# Patient Record
Sex: Female | Born: 1990 | Race: White | Hispanic: No | Marital: Married | Smoking: Never smoker
Health system: Southern US, Community
[De-identification: ages and names within clinical notes are randomized; demographics above are authoritative.]

## PROBLEM LIST (undated history)

## (undated) ENCOUNTER — Inpatient Hospital Stay (HOSPITAL_COMMUNITY): Payer: Self-pay

## (undated) HISTORY — PX: NO PAST SURGERIES: SHX2092

---

## 2017-10-04 ENCOUNTER — Encounter: Payer: Self-pay | Admitting: Obstetrics and Gynecology

## 2017-10-04 ENCOUNTER — Other Ambulatory Visit: Payer: Self-pay

## 2017-10-04 ENCOUNTER — Ambulatory Visit: Payer: BLUE CROSS/BLUE SHIELD | Admitting: Obstetrics and Gynecology

## 2017-10-04 VITALS — BP 112/60 | HR 76 | Resp 16 | Ht 63.0 in | Wt 129.0 lb

## 2017-10-04 DIAGNOSIS — Z01419 Encounter for gynecological examination (general) (routine) without abnormal findings: Secondary | ICD-10-CM

## 2017-10-04 DIAGNOSIS — Z7189 Other specified counseling: Secondary | ICD-10-CM | POA: Diagnosis not present

## 2017-10-04 DIAGNOSIS — Z23 Encounter for immunization: Secondary | ICD-10-CM | POA: Diagnosis not present

## 2017-10-04 DIAGNOSIS — Z7185 Encounter for immunization safety counseling: Secondary | ICD-10-CM

## 2017-10-04 NOTE — Patient Instructions (Signed)

## 2017-10-04 NOTE — Progress Notes (Signed)
27 y.o. G0P0000 Married [place] female here as a new patient for an annual exam.    Loosing her hair.   Taking birth control from [place].  She will refill from [place].   From [place].  Traveling to [place] in October.  PCP:  No PCP  Patient's last menstrual period was 09/25/2017.     Period Cycle (Days): 21 Period Duration (Days): 5 Period Pattern: Regular Menstrual Flow: Moderate Menstrual Control: Maxi pad Menstrual Control Change Freq (Hours): 3-4 Dysmenorrhea: None     Sexually active: Yes.    The current method of family planning is OCP (estrogen/progesterone).   Yasmin.  Exercising: Yes.    running and body pump Smoker:  no  Health Maintenance: Pap:  2 years ago per patient - normal.   History of abnormal Pap:  no TDaP:  UTD per patient Gardasil:   no HIV: never Hep C:  never Screening Labs: discuss today   reports that she has never smoked. She has never used smokeless tobacco. She reports that she drinks about 1.0 standard drinks of alcohol per week. She reports that she does not use drugs.  History reviewed. No pertinent past medical history.  History reviewed. No pertinent surgical history.  Current Outpatient Medications  Medication Sig Dispense Refill  . NON FORMULARY Elani Ciclo 21 - take one tablet by mouth once daily.     No current facility-administered medications for this visit.     Family History  Problem Relation Age of Onset  . Thyroid disease Mother   . Hypertension Father     Review of Systems  All other systems reviewed and are negative.   Exam:   BP 112/60 (BP Location: Right Arm, Patient Position: Sitting, Cuff Size: Normal)   Pulse 76   Resp 16   Ht 5\' 3"  (1.6 m)   Wt 129 lb (58.5 kg)   LMP 09/25/2017   BMI 22.85 kg/m     General appearance: alert, cooperative and appears stated age Head: Normocephalic, without obvious abnormality, atraumatic Neck: no adenopathy, supple, symmetrical, trachea midline and thyroid normal  to inspection and palpation Lungs: clear to auscultation bilaterally Breasts: normal appearance, no masses or tenderness, No nipple retraction or dimpling, No nipple discharge or bleeding, No axillary or supraclavicular adenopathy Heart: regular rate and rhythm Abdomen: soft, non-tender; no masses, no organomegaly Extremities: extremities normal, atraumatic, no cyanosis or edema Skin: Skin color, texture, turgor normal. No rashes or lesions Lymph nodes: Cervical, supraclavicular, and axillary nodes normal. No abnormal inguinal nodes palpated Neurologic: Grossly normal  Pelvic: External genitalia:  no lesions              Urethra:  normal appearing urethra with no masses, tenderness or lesions              Bartholins and Skenes: normal                 Vagina: normal appearing vagina with normal color and discharge, no lesions              Cervix: no lesions              Pap taken: No. Bimanual Exam:  Uterus:  normal size, contour, position, consistency, mobility, non-tender              Adnexa: no mass, fullness, tenderness                 Chaperone was present for exam.  Assessment:   Well woman  visit with normal exam. Hair loss.   Plan: Mammogram screening age 64. Recommended self breast awareness. Pap and HR HPV as above. Guidelines for Calcium, Vitamin D, regular exercise program including cardiovascular and weight bearing exercise. TSH, cholesterol, CBC, and CMP.  She declines testosterone check.  Will check iron and ferritin if CBC indicate anemia.  We discussed stress as a potential reason for hair loss.  To dermatology if persists. Will start Gardasil vaccine.  Follow up annually and prn.   After visit summary provided.

## 2017-10-05 LAB — COMPREHENSIVE METABOLIC PANEL
ALT: 17 IU/L (ref 0–32)
AST: 17 IU/L (ref 0–40)
Albumin/Globulin Ratio: 1.8 (ref 1.2–2.2)
Albumin: 4.2 g/dL (ref 3.5–5.5)
Alkaline Phosphatase: 49 IU/L (ref 39–117)
BUN/Creatinine Ratio: 12 (ref 9–23)
BUN: 10 mg/dL (ref 6–20)
Bilirubin Total: 0.3 mg/dL (ref 0.0–1.2)
CALCIUM: 9.4 mg/dL (ref 8.7–10.2)
CO2: 25 mmol/L (ref 20–29)
CREATININE: 0.81 mg/dL (ref 0.57–1.00)
Chloride: 100 mmol/L (ref 96–106)
GFR, EST AFRICAN AMERICAN: 114 mL/min/{1.73_m2} (ref >59)
GFR, EST NON AFRICAN AMERICAN: 99 mL/min/{1.73_m2} (ref >59)
GLUCOSE: 81 mg/dL (ref 65–99)
Globulin, Total: 2.4 g/dL (ref 1.5–4.5)
POTASSIUM: 4.5 mmol/L (ref 3.5–5.2)
Sodium: 138 mmol/L (ref 134–144)
TOTAL PROTEIN: 6.6 g/dL (ref 6.0–8.5)

## 2017-10-05 LAB — CBC
HEMOGLOBIN: 14.1 g/dL (ref 11.1–15.9)
Hematocrit: 41.1 % (ref 34.0–46.6)
MCH: 31.1 pg (ref 26.6–33.0)
MCHC: 34.3 g/dL (ref 31.5–35.7)
MCV: 91 fL (ref 79–97)
Platelets: 330 10*3/uL (ref 150–450)
RBC: 4.54 x10E6/uL (ref 3.77–5.28)
RDW: 13.1 % (ref 12.3–15.4)
WBC: 10.7 10*3/uL (ref 3.4–10.8)

## 2017-10-05 LAB — LIPID PANEL
CHOL/HDL RATIO: 1.7 ratio (ref 0.0–4.4)
Cholesterol, Total: 232 mg/dL — ABNORMAL HIGH (ref 100–199)
HDL: 133 mg/dL (ref >39)
LDL Calculated: 74 mg/dL (ref 0–99)
TRIGLYCERIDES: 124 mg/dL (ref 0–149)
VLDL CHOLESTEROL CAL: 25 mg/dL (ref 5–40)

## 2017-10-05 LAB — TSH: TSH: 2.53 u[IU]/mL (ref 0.450–4.500)

## 2017-12-02 NOTE — Progress Notes (Signed)
Patient in today for 2nd Gardasil injection. Contraception: OPC LMP: 11/15/17 Last AEX: 10/04/17 with BS   Injection given in Left Deltoid Patient tolerated shot well.   Patient informed next injection due in about 4 months. After 03/26/18  Advised patient, if not on birth control, to return for next injection with cycle.   Routed to provider for final review.  Encounter closed.

## 2017-12-03 ENCOUNTER — Ambulatory Visit (INDEPENDENT_AMBULATORY_CARE_PROVIDER_SITE_OTHER): Payer: BLUE CROSS/BLUE SHIELD

## 2017-12-03 VITALS — BP 108/62 | HR 74 | Resp 17 | Ht 63.0 in | Wt 127.0 lb

## 2017-12-03 DIAGNOSIS — Z23 Encounter for immunization: Secondary | ICD-10-CM | POA: Diagnosis not present

## 2018-04-04 NOTE — Progress Notes (Signed)
Patient in today for 3rd  Gardasil injection.   Contraception: OPC LMP: 03/16/18 Last AEX: 10/04/17 with BS  Injection given in Left Deltoid. Patient tolerated shot well.   Patient has completed the the HPV vaccination series.   Routed to provider for final review.  Encounter closed.

## 2018-04-06 ENCOUNTER — Ambulatory Visit: Payer: Self-pay | Admitting: Medical

## 2018-04-06 ENCOUNTER — Ambulatory Visit (INDEPENDENT_AMBULATORY_CARE_PROVIDER_SITE_OTHER): Payer: BLUE CROSS/BLUE SHIELD

## 2018-04-06 VITALS — BP 116/62 | HR 64 | Resp 16 | Ht 63.0 in | Wt 132.0 lb

## 2018-04-06 DIAGNOSIS — Z23 Encounter for immunization: Secondary | ICD-10-CM

## 2018-05-16 ENCOUNTER — Encounter: Payer: Self-pay | Admitting: Obstetrics and Gynecology

## 2018-05-16 ENCOUNTER — Ambulatory Visit (INDEPENDENT_AMBULATORY_CARE_PROVIDER_SITE_OTHER): Payer: BLUE CROSS/BLUE SHIELD | Admitting: Obstetrics and Gynecology

## 2018-05-16 ENCOUNTER — Other Ambulatory Visit: Payer: Self-pay

## 2018-05-16 ENCOUNTER — Encounter (INDEPENDENT_AMBULATORY_CARE_PROVIDER_SITE_OTHER): Payer: Self-pay

## 2018-05-16 ENCOUNTER — Telehealth: Payer: Self-pay | Admitting: Obstetrics and Gynecology

## 2018-05-16 VITALS — BP 126/70 | HR 80 | Temp 98.2°F | Resp 14 | Ht 63.0 in | Wt 125.0 lb

## 2018-05-16 DIAGNOSIS — N6320 Unspecified lump in the left breast, unspecified quadrant: Secondary | ICD-10-CM | POA: Diagnosis not present

## 2018-05-16 NOTE — Telephone Encounter (Signed)
Spoke with patients spouse, Arvilla Market, ok per dpr. Requesting OV for left breast lump. Noticed on 05/12/18, painful to touch. Denies skin changes, nipple d/c, fever/chills, cough. LMP 05/07/18. OV scheduled for today at 2pm with Dr. Edward Jolly. Spouse verbalizes understanding and is agreeable.   Encounter closed.

## 2018-05-16 NOTE — Progress Notes (Signed)
GYNECOLOGY  VISIT   HPI: 28 y.o.   Married  [place]  female   G0P0000 with Patient's last menstrual period was 05/07/2018.   here for left breast lump; patient noticed lump on Thursday.    Not sure if having pain or not.   She had a breast US in [place] 2 years ago as a routine exam, and this was normal.  No recent trauma.   Paternal GM with breast cancer.  GYNECOLOGIC HISTORY: Patient's last menstrual period was 05/07/2018. Contraception:  Yasmin Menopausal hormone therapy:  n/a Last mammogram:  n/a Last pap smear:   2 years ago per patient - normal        OB History    Gravida  0   Para  0   Term  0   Preterm  0   AB  0   Living  0     SAB  0   TAB  0   Ectopic  0   Multiple  0   Live Births  0              There are no active problems to display for this patient.   No past medical history on file.  No past surgical history on file.  Current Outpatient Medications  Medication Sig Dispense Refill  . NON FORMULARY Elani Ciclo 21 - take one tablet by mouth once daily.     No current facility-administered medications for this visit.      ALLERGIES: Patient has no known allergies.  Family History  Problem Relation Age of Onset  . Thyroid disease Mother   . Hypertension Father     Social History   Socioeconomic History  . Marital status: Married    Spouse name: Not on file  . Number of children: Not on file  . Years of education: Not on file  . Highest education level: Not on file  Occupational History  . Not on file  Social Needs  . Financial resource strain: Not on file  . Food insecurity:    Worry: Not on file    Inability: Not on file  . Transportation needs:    Medical: Not on file    Non-medical: Not on file  Tobacco Use  . Smoking status: Never Smoker  . Smokeless tobacco: Never Used  Substance and Sexual Activity  . Alcohol use: Yes    Alcohol/week: 1.0 standard drinks    Types: 1 Cans of beer per week  . Drug use:  Never  . Sexual activity: Yes    Birth control/protection: Pill  Lifestyle  . Physical activity:    Days per week: Not on file    Minutes per session: Not on file  . Stress: Not on file  Relationships  . Social connections:    Talks on phone: Not on file    Gets together: Not on file    Attends religious service: Not on file    Active member of club or organization: Not on file    Attends meetings of clubs or organizations: Not on file    Relationship status: Not on file  . Intimate partner violence:    Fear of current or ex partner: Not on file    Emotionally abused: Not on file    Physically abused: Not on file    Forced sexual activity: Not on file  Other Topics Concern  . Not on file  Social History Narrative  . Not on file  Review of Systems  Constitutional:       Left breast lump  HENT: Negative.   Eyes: Negative.   Respiratory: Negative.   Cardiovascular: Negative.   Gastrointestinal: Negative.   Endocrine: Negative.   Genitourinary: Negative.   Musculoskeletal: Negative.   Skin: Negative.   Allergic/Immunologic: Negative.   Neurological: Negative.   Hematological: Negative.   Psychiatric/Behavioral: Negative.     PHYSICAL EXAMINATION:    BP 126/70 (BP Location: Left Arm, Patient Position: Sitting, Cuff Size: Normal)   Pulse 80   Temp 98.2 F (36.8 C) (Oral)   Resp 14   Ht 5\' 3"  (1.6 m)   Wt 125 lb (56.7 kg)   LMP 05/07/2018   BMI 22.14 kg/m     General appearance: alert, cooperative and appears stated age   Breasts: right - normal appearance, no masses or tenderness, No nipple retraction or dimpling, No nipple discharge or bleeding, No axillary adenopathy  Left breast - normal appearance,  8 mm oval mass at 11:00 and nontender, No nipple retraction or dimpling, No nipple discharge or bleeding, No axillary adenopathy  Chaperone was present for exam.  ASSESSMENT  Left breast mass.   PLAN  We discussed fibrocystic breast disease.  We  reviewed options for dx [place] versus breast recheck in 6 weeks.  Will do the latter.  She will continue to monitor and be seen sooner if the mass is increasing in size.  Questions invited and answered.   An After Visit Summary was printed and given to the patient.  __15____ minutes face to face time of which over 50% was spent in counseling.

## 2018-05-16 NOTE — Telephone Encounter (Signed)
Patient's spouse (ok per dpr) calling because she found a lump in her left breast. His number is [phone].

## 2018-05-16 NOTE — Patient Instructions (Signed)
Fibrocystic Breast Changes    Fibrocystic breast changes are changes in breast tissue that can cause breasts to become swollen, lumpy, or painful. This can happen due to buildup of scar-like tissue (fibrous tissue) or the forming of fluid-filled lumps (cysts) in the breast. This is a common condition, and it is not cancerous (is benign). The exact cause is not known, but it seems to occur when women go through hormonal changes during their menstrual cycle. Fibrocystic breast changes can affect one or both breasts.  What are the causes?  The exact cause of fibrocystic breast changes is not known. However, this condition:   May be related to the female hormones estrogen and progesterone.   May be influenced by family traits that get passed from parent to child (genetics).  What are the signs or symptoms?  Symptoms of this condition may affect one or both breasts, and may include:   Tenderness, mild discomfort, or pain.   Swelling.   Rope-like tissue that can be felt when touching the breast.   Lumps in one or both breasts.   Changes in breast size. Breasts may get larger before the menstrual period and smaller after the menstrual period.   Green or dark brown discharge from the nipple.  Symptoms are usually worse before menstrual periods start, and they get better toward the end of menstrual periods.  How is this diagnosed?  This condition is diagnosed based on your medical history and a physical exam of your breasts. You may also have tests, such as:   A breast X-ray (mammogram).   Ultrasound of your breasts.   MRI.   Removal of a breast tissue sample for testing (breast biopsy). This may be done if your health care provider thinks that something else may be causing changes in your breasts.  How is this treated?  Often, treatment is not needed for this condition. In some cases, treatment may include:   Taking over-the-counter pain relievers to help lessen pain or discomfort.   Limiting or avoiding  caffeine. Foods and beverages that contain caffeine include chocolate, soda, coffee, and tea.   Reducing sugar and fat in your diet.  Your health care provider may also recommend:   A procedure to remove fluid from a cyst that is causing pain (fine needle aspiration).   Surgery to remove a cyst that is large or tender or does not go away.  Follow these instructions at home:   Examine your breasts after every menstrual period. If you do not have menstrual periods, check your breasts on the first day of every month. Feel for changes in your breasts, such as:  ? More tenderness.  ? A new growth.  ? A change in size.  ? A change in an existing lump.   Take over-the-counter and prescription medicines only as told by your health care provider.   Wear a well-fitted support or sports bra, especially when exercising.   Decrease or avoid caffeine, fat, and sugar in your diet as directed by your health care provider.  Contact a health care provider if:   You have fluid leaking from your nipple, especially if it is bloody.   You have new lumps or bumps in your breast.   Your breast becomes enlarged, red, and painful.   You have areas of your breast that pucker inward.   Your nipple appears flat or indented.  Get help right away if:   You have redness of your breast and the redness is spreading.    Summary   Fibrocystic breast changes are changes in breast tissue that can cause breasts to become swollen, lumpy, or painful.   This condition may be related to the female hormones estrogen and progesterone.   With this condition, it is important to examine your breasts after every menstrual period. If you do not have menstrual periods, check your breasts on the first day of every month.  This information is not intended to replace advice given to you by your health care provider. Make sure you discuss any questions you have with your health care provider.  Document Released: 11/12/2005 Document Revised: 10/08/2015  Document Reviewed: 09/25/2015  Elsevier Interactive Patient Education  2019 Elsevier Inc[employer]

## 2018-06-27 ENCOUNTER — Telehealth: Payer: Self-pay | Admitting: Obstetrics and Gynecology

## 2018-06-27 ENCOUNTER — Encounter: Payer: Self-pay | Admitting: Obstetrics and Gynecology

## 2018-06-27 ENCOUNTER — Ambulatory Visit: Payer: BLUE CROSS/BLUE SHIELD | Admitting: Obstetrics and Gynecology

## 2018-06-27 ENCOUNTER — Other Ambulatory Visit: Payer: Self-pay

## 2018-06-27 VITALS — BP 116/72 | HR 76 | Temp 98.2°F | Resp 12 | Ht 63.0 in | Wt 131.0 lb

## 2018-06-27 DIAGNOSIS — Z3169 Encounter for other general counseling and advice on procreation: Secondary | ICD-10-CM

## 2018-06-27 DIAGNOSIS — N6320 Unspecified lump in the left breast, unspecified quadrant: Secondary | ICD-10-CM

## 2018-06-27 NOTE — Telephone Encounter (Signed)
Spoke with Joaquim Lai at [facility]. Was advised due to patients age, left breast US only. Will need to confirm age of PGM at time of breast cancer Dx.   Call placed to patients spouse, Roderick Pee, was advised patient unsure of age of breast cancer Dx in [facility], "never met her".   Call returned to [facility], spoke with Clarise Cruz. Bilater Dx MMG and left [place] scheduled for first available appt on 6/4 arriving at 12:40pm, appointment at 1pm. Patient will need to request previous breast US images from [place] prior to apportionment. If patient able to obtain, will need to reschedule appt. If unable to obtain images, keep appt as scheduled.

## 2018-06-27 NOTE — Telephone Encounter (Signed)
Spoke with patients spouse, Arvilla Market. Advised of appointment details as seen below at [facility], contact information provided. Spouse states they are unable to obtain records from [place] from previous breast imaging. Spouse verbalizes understanding and is agreeable.   Routing to provider for final review. Patient is agreeable to disposition. Will close encounter.

## 2018-06-27 NOTE — Telephone Encounter (Signed)
Please schedule a dx bilateral mammogram and left breast [facility]  She has a left breast mass that is increasing in size with her breast recheck.  The mass is at 11:00 and measures about 1 cm.   Today I feel a small firmness in the same breast just below this.  It measures about 5 mm.   She speaks [place].  She states her husband is on her DPI form and speaks [place] well.

## 2018-06-27 NOTE — Telephone Encounter (Signed)
Left message to call Fulton Merry, RN at [facility] [phone].   

## 2018-06-27 NOTE — Patient Instructions (Signed)
Consider the book What to Expect When Trying for Pregnancy.

## 2018-06-27 NOTE — Progress Notes (Signed)
GYNECOLOGY  VISIT   HPI: 28 y.o.   Married  [place]  female   G0P0000 with Patient's last menstrual period was 06/01/2018.   here for  6 week breast recheck. Patient states that she thinks that the breast lump has increased in size.   Denies pain.   Had an 8 mm mass at 11:00 of left breast at her office visit on 05/16/18.  We chose to do a recheck instead of an ultrasound at that time. She had a routine breast ultrasound in [place] 2 years ago which was normal.   PGM with breast cancer.   She is interested in future pregnancy.    GYNECOLOGIC HISTORY: Patient's last menstrual period was 06/01/2018. Contraception:  Yasmin Menopausal hormone therapy:  none Last mammogram:  none Last pap smear:   53yrs ago-neg per patient        OB History    Gravida  0   Para  0   Term  0   Preterm  0   AB  0   Living  0     SAB  0   TAB  0   Ectopic  0   Multiple  0   Live Births  0              Patient Active Problem List   Diagnosis Date Noted  . Left breast mass 05/16/2018    History reviewed. No pertinent past medical history.  History reviewed. No pertinent surgical history.  Current Outpatient Medications  Medication Sig Dispense Refill  . NON FORMULARY Elani Ciclo 21 - take one tablet by mouth once daily.     No current facility-administered medications for this visit.      ALLERGIES: Patient has no known allergies.  Family History  Problem Relation Age of Onset  . Thyroid disease Mother   . Hypertension Father     Social History   Socioeconomic History  . Marital status: Married    Spouse name: Not on file  . Number of children: Not on file  . Years of education: Not on file  . Highest education level: Not on file  Occupational History  . Not on file  Social Needs  . Financial resource strain: Not on file  . Food insecurity:    Worry: Not on file    Inability: Not on file  . Transportation needs:    Medical: Not on file    Non-medical:  Not on file  Tobacco Use  . Smoking status: Never Smoker  . Smokeless tobacco: Never Used  Substance and Sexual Activity  . Alcohol use: Yes    Alcohol/week: 1.0 standard drinks    Types: 1 Cans of beer per week  . Drug use: Never  . Sexual activity: Yes    Birth control/protection: Pill  Lifestyle  . Physical activity:    Days per week: Not on file    Minutes per session: Not on file  . Stress: Not on file  Relationships  . Social connections:    Talks on phone: Not on file    Gets together: Not on file    Attends religious service: Not on file    Active member of club or organization: Not on file    Attends meetings of clubs or organizations: Not on file    Relationship status: Not on file  . Intimate partner violence:    Fear of current or ex partner: Not on file    Emotionally abused: Not on  file    Physically abused: Not on file    Forced sexual activity: Not on file  Other Topics Concern  . Not on file  Social History Narrative  . Not on file    Review of Systems  Constitutional:       Breast lump  HENT: Negative.   Eyes: Negative.   Respiratory: Negative.   Cardiovascular: Negative.   Gastrointestinal: Negative.   Endocrine: Negative.   Genitourinary: Negative.   Musculoskeletal: Negative.   Skin: Negative.   Allergic/Immunologic: Negative.   Neurological: Negative.   Hematological: Negative.   Psychiatric/Behavioral: Negative.     PHYSICAL EXAMINATION:    BP 116/72 (BP Location: Right Arm, Patient Position: Sitting, Cuff Size: Normal)   Pulse 76   Temp 98.2 F (36.8 C) (Temporal)   Resp 12   Ht 5\' 3"  (1.6 m)   Wt 131 lb (59.4 kg)   LMP 06/01/2018   BMI 23.21 kg/m     General appearance: alert, cooperative and appears stated age   Breasts: right - normal appearance, no masses or tenderness, No nipple retraction or dimpling, No nipple discharge or bleeding, No axillary or supraclavicular adenopathy Left - normal appearance, 1 cm mass at 11:00  and smaller 5 mm area below this, no tenderness, No nipple retraction or dimpling, No nipple discharge or bleeding, No axillary or supraclavicular adenopathy  Chaperone was present for exam.  ASSESSMENT  Left breast masses.  I suspect fibrocystic disease.  Preconception counseling.   PLAN  Plan for dx mammogram and left breast US.  I discussed preconception care - start PNV, avoid exposures, exercise, food choices. Check Rubella titer. She will stop her OCPs in 3 months, use condoms, and monitor her menses.  AEX in August 2020.   An After Visit Summary was printed and given to the patient.  __15____ minutes face to face time of which over 50% was spent in counseling.

## 2018-06-28 ENCOUNTER — Ambulatory Visit
Admission: RE | Admit: 2018-06-28 | Discharge: 2018-06-28 | Disposition: A | Discharge status: Home / Self Care | Payer: BLUE CROSS/BLUE SHIELD | Source: Ambulatory Visit | Attending: Obstetrics and Gynecology | Admitting: Obstetrics and Gynecology

## 2018-06-28 DIAGNOSIS — N6320 Unspecified lump in the left breast, unspecified quadrant: Secondary | ICD-10-CM

## 2018-06-28 DIAGNOSIS — N6322 Unspecified lump in the left breast, upper inner quadrant: Secondary | ICD-10-CM | POA: Diagnosis not present

## 2018-06-28 LAB — RUBELLA SCREEN: Rubella Antibodies, IGG: 1.83 index (ref >0.99)

## 2018-07-14 ENCOUNTER — Other Ambulatory Visit: Payer: BLUE CROSS/BLUE SHIELD

## 2018-09-13 ENCOUNTER — Telehealth: Payer: Self-pay | Admitting: Obstetrics and Gynecology

## 2018-09-13 NOTE — Telephone Encounter (Signed)
Patient's husband, Roderick Pee, calling to schedule pregnancy confirmation. Positive UPT. Can be reached at [phone].

## 2018-09-13 NOTE — Telephone Encounter (Signed)
Spoke with patients spouse, Roderick Pee, ok per dpr. Positive UPT 09/13/18. LMP 08/06/18 -08/10/18. Denies vaginal bleeding, pain, N/V. Requesting OV for pregnancy confirmation.   [id number] prescreen negative, precautions reviewed. OV scheduled for 8/5 at 11am with Dr. Quincy Simmonds.   Routing to provider for final review. Patient is agreeable to disposition. Will close encounter.

## 2018-09-14 ENCOUNTER — Encounter: Payer: Self-pay | Admitting: Obstetrics and Gynecology

## 2018-09-14 ENCOUNTER — Ambulatory Visit (INDEPENDENT_AMBULATORY_CARE_PROVIDER_SITE_OTHER): Payer: BLUE CROSS/BLUE SHIELD | Admitting: Obstetrics and Gynecology

## 2018-09-14 ENCOUNTER — Other Ambulatory Visit: Payer: Self-pay

## 2018-09-14 VITALS — BP 100/72 | HR 76 | Temp 97.9°F | Ht 63.0 in

## 2018-09-14 DIAGNOSIS — Z3201 Encounter for pregnancy test, result positive: Secondary | ICD-10-CM | POA: Diagnosis not present

## 2018-09-14 DIAGNOSIS — N926 Irregular menstruation, unspecified: Secondary | ICD-10-CM

## 2018-09-14 DIAGNOSIS — Z3490 Encounter for supervision of normal pregnancy, unspecified, unspecified trimester: Secondary | ICD-10-CM

## 2018-09-14 LAB — POCT URINE PREGNANCY: Preg Test, Ur: POSITIVE — AB

## 2018-09-14 NOTE — Progress Notes (Signed)
GYNECOLOGY  VISIT   HPI: 28 y.o.   Married  [place]  female   G0P0000 with Patient's last menstrual period was 08/06/2018 (exact date).   here for pregnancy confirmation. Multiple questions about pregnancy care.   Patient had positive home UPT.    Stopped OCPs. In May, 2020.   [id number]:Positive  GYNECOLOGIC HISTORY: Patient's last menstrual period was 08/06/2018 (exact date). Contraception: trying for pregnancy Menopausal hormone therapy:  N/A Last mammogram:  none Last pap smear: 2018 Neg per patient        OB History    Gravida  0   Para  0   Term  0   Preterm  0   AB  0   Living  0     SAB  0   TAB  0   Ectopic  0   Multiple  0   Live Births  0              Patient Active Problem List   Diagnosis Date Noted  . Left breast mass 05/16/2018    No past medical history on file.  No past surgical history on file.  Current Outpatient Medications  Medication Sig Dispense Refill  . Prenatal Vit-Fe Fumarate-FA (PRENATAL VITAMIN PO) Take 1 tablet by mouth daily.     No current facility-administered medications for this visit.      ALLERGIES: Patient has no known allergies.  Family History  Problem Relation Age of Onset  . Thyroid disease Mother   . Hypertension Father   . Breast cancer Paternal Grandmother        unsure age of onset    Social History   Socioeconomic History  . Marital status: Married    Spouse name: Not on file  . Number of children: Not on file  . Years of education: Not on file  . Highest education level: Not on file  Occupational History  . Not on file  Social Needs  . Financial resource strain: Not on file  . Food insecurity    Worry: Not on file    Inability: Not on file  . Transportation needs    Medical: Not on file    Non-medical: Not on file  Tobacco Use  . Smoking status: Never Smoker  . Smokeless tobacco: Never Used  Substance and Sexual Activity  . Alcohol use: Yes    Alcohol/week: 1.0 standard drinks     Types: 1 Cans of beer per week  . Drug use: Never  . Sexual activity: Yes    Birth control/protection: Pill  Lifestyle  . Physical activity    Days per week: Not on file    Minutes per session: Not on file  . Stress: Not on file  Relationships  . Social Musicianconnections    Talks on phone: Not on file    Gets together: Not on file    Attends religious service: Not on file    Active member of club or organization: Not on file    Attends meetings of clubs or organizations: Not on file    Relationship status: Not on file  . Intimate partner violence    Fear of current or ex partner: Not on file    Emotionally abused: Not on file    Physically abused: Not on file    Forced sexual activity: Not on file  Other Topics Concern  . Not on file  Social History Narrative  . Not on file    Review of  Systems  All other systems reviewed and are negative.   PHYSICAL EXAMINATION:    BP 100/72   Pulse 76   Temp 97.9 F (36.6 C) (Temporal)   Ht 5\' 3"  (1.6 m)   LMP 08/06/2018 (Exact Date)   BMI 23.21 kg/m     General appearance: alert, cooperative and appears stated age   ASSESSMENT  5+[redacted] weeks gestation.  [facility] 05/14/18.  PLAN  Early pregnancy counseling done - nutrition, exercise, sexual activity in pregnancy, avoidance of exposures, avoid cat liter box (no cats at home), caffeine consumption, depressed immune status, recommendation for flu vaccine injection.  We reviewed What to Expect When Expecting.  She will establish care directly with an OB office.  List of offices in the area given to patient.  FU after delivery and postpartum visit.   An After Visit Summary was printed and given to the patient.  __25____ minutes face to face time of which over 50% was spent in counseling.

## 2018-09-15 DIAGNOSIS — Z3200 Encounter for pregnancy test, result unknown: Secondary | ICD-10-CM | POA: Diagnosis not present

## 2018-09-15 DIAGNOSIS — Z3689 Encounter for other specified antenatal screening: Secondary | ICD-10-CM | POA: Diagnosis not present

## 2018-09-15 LAB — OB RESULTS CONSOLE GC/CHLAMYDIA
Chlamydia: NEGATIVE
Gonorrhea: NEGATIVE

## 2018-09-15 LAB — OB RESULTS CONSOLE ABO/RH: RH Type: POSITIVE

## 2018-09-15 LAB — OB RESULTS CONSOLE ANTIBODY SCREEN: Antibody Screen: NEGATIVE

## 2018-09-15 LAB — OB RESULTS CONSOLE HIV ANTIBODY (ROUTINE TESTING): HIV: NONREACTIVE

## 2018-09-15 LAB — OB RESULTS CONSOLE RUBELLA ANTIBODY, IGM: Rubella: IMMUNE

## 2018-09-15 LAB — OB RESULTS CONSOLE RPR: RPR: NONREACTIVE

## 2018-09-15 LAB — OB RESULTS CONSOLE HEPATITIS B SURFACE ANTIGEN: Hepatitis B Surface Ag: NEGATIVE

## 2018-09-28 DIAGNOSIS — Z3201 Encounter for pregnancy test, result positive: Secondary | ICD-10-CM | POA: Diagnosis not present

## 2018-10-06 ENCOUNTER — Ambulatory Visit: Payer: BC Managed Care – PPO | Admitting: Obstetrics and Gynecology

## 2018-10-19 DIAGNOSIS — Z3689 Encounter for other specified antenatal screening: Secondary | ICD-10-CM | POA: Diagnosis not present

## 2018-10-19 DIAGNOSIS — Z118 Encounter for screening for other infectious and parasitic diseases: Secondary | ICD-10-CM | POA: Diagnosis not present

## 2018-10-19 DIAGNOSIS — Z3401 Encounter for supervision of normal first pregnancy, first trimester: Secondary | ICD-10-CM | POA: Diagnosis not present

## 2018-10-19 DIAGNOSIS — Z124 Encounter for screening for malignant neoplasm of cervix: Secondary | ICD-10-CM | POA: Diagnosis not present

## 2018-11-07 DIAGNOSIS — Z23 Encounter for immunization: Secondary | ICD-10-CM | POA: Diagnosis not present

## 2018-11-07 DIAGNOSIS — Z3401 Encounter for supervision of normal first pregnancy, first trimester: Secondary | ICD-10-CM | POA: Diagnosis not present

## 2018-11-30 DIAGNOSIS — Z361 Encounter for antenatal screening for raised alphafetoprotein level: Secondary | ICD-10-CM | POA: Diagnosis not present

## 2018-11-30 DIAGNOSIS — Z3402 Encounter for supervision of normal first pregnancy, second trimester: Secondary | ICD-10-CM | POA: Diagnosis not present

## 2018-12-21 DIAGNOSIS — Z3402 Encounter for supervision of normal first pregnancy, second trimester: Secondary | ICD-10-CM | POA: Diagnosis not present

## 2018-12-21 DIAGNOSIS — Z363 Encounter for antenatal screening for malformations: Secondary | ICD-10-CM | POA: Diagnosis not present

## 2018-12-22 DIAGNOSIS — Z03818 Encounter for observation for suspected exposure to other biological agents ruled out: Secondary | ICD-10-CM | POA: Diagnosis not present

## 2018-12-22 DIAGNOSIS — Z20828 Contact with and (suspected) exposure to other viral communicable diseases: Secondary | ICD-10-CM | POA: Diagnosis not present

## 2019-01-18 DIAGNOSIS — Z3402 Encounter for supervision of normal first pregnancy, second trimester: Secondary | ICD-10-CM | POA: Diagnosis not present

## 2019-02-10 NOTE — L&D Delivery Note (Signed)
Delivery Note At 1:09 AM a viable and healthy female was delivered via Vaginal, Spontaneous (Presentation: Right Occiput Anterior).  APGAR: 9, 9; weight  .   Placenta status: Spontaneous, Intact.  Cord: 3 vessels with the following complications: loose nuchal cord .  Cord pH: N/A  Anesthesia: Epidural Episiotomy: Left Mediolateral Lacerations: None other Suture Repair: 3.0 vicryl rapide Est. Blood Loss (mL): 550  Mom to postpartum.  Baby to Couplet care / Skin to Skin.  Robley Fries 05/09/2019, 1:57 AM

## 2019-02-15 DIAGNOSIS — Z3689 Encounter for other specified antenatal screening: Secondary | ICD-10-CM | POA: Diagnosis not present

## 2019-02-15 DIAGNOSIS — Z3402 Encounter for supervision of normal first pregnancy, second trimester: Secondary | ICD-10-CM | POA: Diagnosis not present

## 2019-02-15 DIAGNOSIS — Z23 Encounter for immunization: Secondary | ICD-10-CM | POA: Diagnosis not present

## 2019-03-02 DIAGNOSIS — Z3403 Encounter for supervision of normal first pregnancy, third trimester: Secondary | ICD-10-CM | POA: Diagnosis not present

## 2019-03-02 DIAGNOSIS — Z3689 Encounter for other specified antenatal screening: Secondary | ICD-10-CM | POA: Diagnosis not present

## 2019-03-15 DIAGNOSIS — Z3403 Encounter for supervision of normal first pregnancy, third trimester: Secondary | ICD-10-CM | POA: Diagnosis not present

## 2019-03-29 DIAGNOSIS — Z3A33 33 weeks gestation of pregnancy: Secondary | ICD-10-CM | POA: Diagnosis not present

## 2019-03-29 DIAGNOSIS — O365930 Maternal care for other known or suspected poor fetal growth, third trimester, not applicable or unspecified: Secondary | ICD-10-CM | POA: Diagnosis not present

## 2019-04-12 DIAGNOSIS — Z3A35 35 weeks gestation of pregnancy: Secondary | ICD-10-CM | POA: Diagnosis not present

## 2019-04-12 DIAGNOSIS — Z3685 Encounter for antenatal screening for Streptococcus B: Secondary | ICD-10-CM | POA: Diagnosis not present

## 2019-04-12 DIAGNOSIS — O365930 Maternal care for other known or suspected poor fetal growth, third trimester, not applicable or unspecified: Secondary | ICD-10-CM | POA: Diagnosis not present

## 2019-04-12 LAB — OB RESULTS CONSOLE GBS: GBS: NEGATIVE

## 2019-04-20 DIAGNOSIS — Z3A36 36 weeks gestation of pregnancy: Secondary | ICD-10-CM | POA: Diagnosis not present

## 2019-04-20 DIAGNOSIS — O365930 Maternal care for other known or suspected poor fetal growth, third trimester, not applicable or unspecified: Secondary | ICD-10-CM | POA: Diagnosis not present

## 2019-04-25 ENCOUNTER — Telehealth (HOSPITAL_COMMUNITY): Payer: Self-pay | Admitting: *Deleted

## 2019-04-25 ENCOUNTER — Encounter (HOSPITAL_COMMUNITY): Payer: Self-pay | Admitting: *Deleted

## 2019-04-25 NOTE — Telephone Encounter (Signed)
Preadmission screen  

## 2019-04-26 DIAGNOSIS — Z3A37 37 weeks gestation of pregnancy: Secondary | ICD-10-CM | POA: Diagnosis not present

## 2019-04-26 DIAGNOSIS — O365930 Maternal care for other known or suspected poor fetal growth, third trimester, not applicable or unspecified: Secondary | ICD-10-CM | POA: Diagnosis not present

## 2019-04-28 ENCOUNTER — Telehealth (HOSPITAL_COMMUNITY): Payer: Self-pay | Admitting: *Deleted

## 2019-04-28 NOTE — Telephone Encounter (Signed)
Preadmission screen  

## 2019-05-03 ENCOUNTER — Telehealth (HOSPITAL_COMMUNITY): Payer: Self-pay | Admitting: *Deleted

## 2019-05-03 NOTE — Telephone Encounter (Signed)
Preadmission screen  

## 2019-05-04 ENCOUNTER — Telehealth (HOSPITAL_COMMUNITY): Payer: Self-pay | Admitting: *Deleted

## 2019-05-04 ENCOUNTER — Other Ambulatory Visit: Payer: Self-pay | Admitting: Obstetrics & Gynecology

## 2019-05-04 NOTE — Telephone Encounter (Signed)
Preadmission screen  

## 2019-05-05 DIAGNOSIS — O365930 Maternal care for other known or suspected poor fetal growth, third trimester, not applicable or unspecified: Secondary | ICD-10-CM | POA: Diagnosis not present

## 2019-05-05 DIAGNOSIS — Z3A38 38 weeks gestation of pregnancy: Secondary | ICD-10-CM | POA: Diagnosis not present

## 2019-05-06 ENCOUNTER — Other Ambulatory Visit (HOSPITAL_COMMUNITY)
Admission: RE | Admit: 2019-05-06 | Discharge: 2019-05-06 | Disposition: A | Discharge status: Home / Self Care | Payer: BC Managed Care – PPO | Source: Ambulatory Visit | Attending: Obstetrics & Gynecology | Admitting: Obstetrics & Gynecology

## 2019-05-06 DIAGNOSIS — Z01812 Encounter for preprocedural laboratory examination: Secondary | ICD-10-CM | POA: Diagnosis not present

## 2019-05-06 DIAGNOSIS — Z20822 Contact with and (suspected) exposure to covid-19: Secondary | ICD-10-CM | POA: Insufficient documentation

## 2019-05-06 LAB — SARS CORONAVIRUS 2 (TAT 6-24 HRS): SARS Coronavirus 2: NEGATIVE

## 2019-05-08 ENCOUNTER — Inpatient Hospital Stay (HOSPITAL_COMMUNITY)
Admission: AD | Admit: 2019-05-08 | Discharge: 2019-05-11 | DRG: 806 | Disposition: A | Discharge status: Home / Self Care | Payer: BC Managed Care – PPO | Attending: Obstetrics & Gynecology | Admitting: Obstetrics & Gynecology

## 2019-05-08 ENCOUNTER — Other Ambulatory Visit: Payer: Self-pay

## 2019-05-08 ENCOUNTER — Inpatient Hospital Stay (HOSPITAL_COMMUNITY): Payer: BC Managed Care – PPO | Admitting: Anesthesiology

## 2019-05-08 ENCOUNTER — Inpatient Hospital Stay (HOSPITAL_COMMUNITY): Payer: BC Managed Care – PPO

## 2019-05-08 ENCOUNTER — Encounter (HOSPITAL_COMMUNITY): Payer: Self-pay | Admitting: Obstetrics & Gynecology

## 2019-05-08 DIAGNOSIS — Z3490 Encounter for supervision of normal pregnancy, unspecified, unspecified trimester: Secondary | ICD-10-CM | POA: Diagnosis present

## 2019-05-08 DIAGNOSIS — D62 Acute posthemorrhagic anemia: Secondary | ICD-10-CM | POA: Diagnosis not present

## 2019-05-08 DIAGNOSIS — O9081 Anemia of the puerperium: Secondary | ICD-10-CM | POA: Diagnosis not present

## 2019-05-08 DIAGNOSIS — Z3A39 39 weeks gestation of pregnancy: Secondary | ICD-10-CM

## 2019-05-08 DIAGNOSIS — O365930 Maternal care for other known or suspected poor fetal growth, third trimester, not applicable or unspecified: Secondary | ICD-10-CM | POA: Diagnosis not present

## 2019-05-08 LAB — TYPE AND SCREEN
ABO/RH(D): B POS
Antibody Screen: NEGATIVE

## 2019-05-08 LAB — ABO/RH: ABO/RH(D): B POS

## 2019-05-08 LAB — CBC
HCT: 39.7 % (ref 36.0–46.0)
Hemoglobin: 13.3 g/dL (ref 12.0–15.0)
MCH: 31.2 pg (ref 26.0–34.0)
MCHC: 33.5 g/dL (ref 30.0–36.0)
MCV: 93.2 fL (ref 80.0–100.0)
Platelets: 242 10*3/uL (ref 150–400)
RBC: 4.26 MIL/uL (ref 3.87–5.11)
RDW: 13.4 % (ref 11.5–15.5)
WBC: 12.1 10*3/uL — ABNORMAL HIGH (ref 4.0–10.5)
nRBC: 0 % (ref 0.0–0.2)

## 2019-05-08 LAB — RPR: RPR Ser Ql: NONREACTIVE

## 2019-05-08 MED ORDER — LACTATED RINGERS IV SOLN: 500.0000 mL | Freq: Once | INTRAVENOUS | Status: DC

## 2019-05-08 MED ORDER — OXYTOCIN 40 UNITS IN NORMAL SALINE INFUSION - SIMPLE MED
2.5000 [IU]/h | INTRAVENOUS | Status: DC
  Administered 2019-05-09: 2.5 [IU]/h via INTRAVENOUS

## 2019-05-08 MED ORDER — LIDOCAINE HCL (PF) 1 % IJ SOLN: 30.0000 mL | INTRAMUSCULAR | Status: DC | PRN

## 2019-05-08 MED ORDER — LIDOCAINE HCL (PF) 1 % IJ SOLN
INTRAMUSCULAR | Status: DC | PRN
  Administered 2019-05-08 (×2): 4 mL via EPIDURAL

## 2019-05-08 MED ORDER — FENTANYL-BUPIVACAINE-NACL 0.5-0.125-0.9 MG/250ML-% EP SOLN
12.0000 mL/h | EPIDURAL | Status: DC | PRN
  Filled 2019-05-08: qty 250

## 2019-05-08 MED ORDER — EPHEDRINE 5 MG/ML INJ: 10.0000 mg | INTRAVENOUS | Status: DC | PRN

## 2019-05-08 MED ORDER — SOD CITRATE-CITRIC ACID 500-334 MG/5ML PO SOLN: 30.0000 mL | ORAL | Status: DC | PRN

## 2019-05-08 MED ORDER — ONDANSETRON HCL 4 MG/2ML IJ SOLN: 4.0000 mg | Freq: Four times a day (QID) | INTRAMUSCULAR | Status: DC | PRN

## 2019-05-08 MED ORDER — OXYTOCIN BOLUS FROM INFUSION
500.0000 mL | Freq: Once | INTRAVENOUS | Status: AC
  Administered 2019-05-09: 500 mL via INTRAVENOUS

## 2019-05-08 MED ORDER — OXYTOCIN 40 UNITS IN NORMAL SALINE INFUSION - SIMPLE MED
1.0000 m[IU]/min | INTRAVENOUS | Status: DC
  Administered 2019-05-08: 2 m[IU]/min via INTRAVENOUS
  Filled 2019-05-08: qty 1000

## 2019-05-08 MED ORDER — LACTATED RINGERS IV SOLN: INTRAVENOUS | Status: DC

## 2019-05-08 MED ORDER — PHENYLEPHRINE 40 MCG/ML (10ML) SYRINGE FOR IV PUSH (FOR BLOOD PRESSURE SUPPORT): 80.0000 ug | PREFILLED_SYRINGE | INTRAVENOUS | Status: DC | PRN

## 2019-05-08 MED ORDER — LACTATED RINGERS IV SOLN: 500.0000 mL | INTRAVENOUS | Status: DC | PRN

## 2019-05-08 MED ORDER — ACETAMINOPHEN 325 MG PO TABS: 650.0000 mg | ORAL_TABLET | ORAL | Status: DC | PRN

## 2019-05-08 MED ORDER — SODIUM CHLORIDE (PF) 0.9 % IJ SOLN
INTRAMUSCULAR | Status: DC | PRN
  Administered 2019-05-08: 12 mL/h via EPIDURAL

## 2019-05-08 MED ORDER — DIPHENHYDRAMINE HCL 50 MG/ML IJ SOLN: 12.5000 mg | INTRAMUSCULAR | Status: DC | PRN

## 2019-05-08 MED ORDER — TERBUTALINE SULFATE 1 MG/ML IJ SOLN: 0.2500 mg | Freq: Once | INTRAMUSCULAR | Status: DC | PRN

## 2019-05-08 MED ORDER — LACTATED RINGERS IV SOLN
500.0000 mL | Freq: Once | INTRAVENOUS | Status: AC
  Administered 2019-05-08: 500 mL via INTRAVENOUS

## 2019-05-08 NOTE — Progress Notes (Signed)
Dawn Johnson is a 29 y.o. G1P0000 at [redacted]w[redacted]d by ultrasound admitted for induction of labor due to poor fetal growth, overall 11% and AC 1%.  Subjective: Some pain with UCs but declines epidural bolus   Objective: BP 108/69   Pulse 80   Temp 98.6 F (37 C) (Oral)   Resp 18   Ht 5\' 3"  (1.6 m)   Wt 72.1 kg   LMP 08/06/2018 (Exact Date)   BMI 28.17 kg/m  No intake/output data recorded. Total I/O In: -  Out: 1225 [Urine:1225]  FHT:  FHR: 130 bpm, variability: moderate,  accelerations:  Present,  decelerations:  Present intermittent variable decels with contractions  UC:   regular, every 3-4 minutes SVE:  Dilation: 8 Effacement (%): 80 Station: 0 Exam by:: Camela Wich  Large caput and moulding, LOT.   Assessment / Plan: Induction of labor due to IUGR,  progressing well on pitocin but now unchanged cervix since last exam, so continue close monitoring  Fetal Wellbeing:  Category I Pain Control:  Epidural I/D:  n/a Anticipated MOD:  NSVD plan, EFW 6 lbs.  Right lateral position with peanut ball to help rotation   [email or web address] 05/08/2019, 8:19 PM

## 2019-05-08 NOTE — Progress Notes (Signed)
Dawn Johnson is a 29 y.o. G1P0000 at [redacted]w[redacted]d by ultrasound admitted for induction of labor due to poor fetal growth, overall 11% and AC 1%.  Subjective: Some rectal pressure, sitting up in throne position  Objective: BP 124/72   Pulse 90   Temp 99.5 F (37.5 C) (Oral)   Resp 20   Ht 5\' 3"  (1.6 m)   Wt 72.1 kg   LMP 08/06/2018 (Exact Date)   BMI 28.17 kg/m  No intake/output data recorded. Total I/O In: -  Out: 1450 [Urine:1450]  FHR: 130 bpm, variability: moderate,  accelerations:  Present,  decelerations:  Present intermittent variable decels with contractions , decels improved/ resolved since last 1hr UC:   regular, every 3-4 minutes SVE:  Dilation: 10 Effacement (%): 80 Station: 0, Plus 1 Exam by:: Corey Caulfield  Thin anterior rim but reducible  Assessment / Plan: Induction of labor due to IUGR,  progressing well on pitocin , entering 2nd stage Right exaggerated Sims / or peanut to allow rotation and bit more descent and reassess at 11.30 pm if ready to push.  Fetal Wellbeing:  Category I Pain Control:  Epidural I/D:  n/a Anticipated MOD:  NSVD plan, EFW 6 lbs.    [email or web address] 05/08/2019, 10:51 PM

## 2019-05-08 NOTE — Anesthesia Preprocedure Evaluation (Addendum)
Anesthesia Evaluation  Patient identified by MRN, date of birth, ID band Patient awake    Reviewed: Allergy & Precautions, Patient's Chart, lab work & pertinent test results  History of Anesthesia Complications Negative for: history of anesthetic complications  Airway Mallampati: II  TM Distance: >3 FB Neck ROM: Full    Dental no notable dental hx.    Pulmonary neg pulmonary ROS,    Pulmonary exam normal        Cardiovascular negative cardio ROS Normal cardiovascular exam     Neuro/Psych negative neurological ROS  negative psych ROS   GI/Hepatic negative GI ROS, Neg liver ROS,   Endo/Other  negative endocrine ROS  Renal/GU negative Renal ROS  negative genitourinary   Musculoskeletal negative musculoskeletal ROS (+)   Abdominal   Peds  Hematology negative hematology ROS (+)   Anesthesia Other Findings Day of surgery medications reviewed with patient.  Reproductive/Obstetrics (+) Pregnancy                             Anesthesia Physical Anesthesia Plan  ASA: II  Anesthesia Plan: Epidural   Post-op Pain Management:    Induction:   PONV Risk Score and Plan: Treatment may vary due to age or medical condition  Airway Management Planned: Natural Airway  Additional Equipment:   Intra-op Plan:   Post-operative Plan:   Informed Consent: I have reviewed the patients History and Physical, chart, labs and discussed the procedure including the risks, benefits and alternatives for the proposed anesthesia with the patient or authorized representative who has indicated his/her understanding and acceptance.       Plan Discussed with:   Anesthesia Plan Comments: (Consent obtained with [place] iPad interpreter and English-speaking husband in room.)       Anesthesia Quick Evaluation

## 2019-05-08 NOTE — Anesthesia Procedure Notes (Signed)
Epidural Patient location during procedure: OB Start time: 05/08/2019 11:21 AM End time: 05/08/2019 11:24 AM  Staffing Anesthesiologist: Kaylyn Layer, MD Performed: anesthesiologist   Preanesthetic Checklist Completed: patient identified, IV checked, risks and benefits discussed, monitors and equipment checked, pre-op evaluation and timeout performed  Epidural Patient position: sitting Prep: DuraPrep and site prepped and draped Patient monitoring: continuous pulse ox, blood pressure and heart rate Approach: midline Location: L3-L4 Injection technique: LOR air  Needle:  Needle type: Tuohy  Needle gauge: 17 G Needle length: 9 cm Catheter type: closed end flexible Catheter size: 19 Gauge Catheter at skin depth: 10 cm Test dose: negative and Other (1% lidocaine)  Assessment Events: blood not aspirated, injection not painful, no injection resistance, no paresthesia and negative IV test  Additional Notes Patient identified. Risks, benefits, and alternatives discussed with patient including but not limited to bleeding, infection, nerve damage, paralysis, failed block, incomplete pain control, headache, blood pressure changes, nausea, vomiting, reactions to medication, itching, and postpartum back pain. Confirmed with bedside nurse the patient's most recent platelet count. Confirmed with patient that they are not currently taking any anticoagulation, have any bleeding history, or any family history of bleeding disorders. Patient expressed understanding and wished to proceed. All questions were answered. Sterile technique was used throughout the entire procedure. Please see nursing notes for vital signs.   Crisp LOR on first pass. Test dose was given through epidural catheter and negative prior to continuing to dose epidural or start infusion. Warning signs of high block given to the patient including shortness of breath, tingling/numbness in hands, complete motor block, or any concerning  symptoms with instructions to call for help. Patient was given instructions on fall risk and not to get out of bed. All questions and concerns addressed with instructions to call with any issues or inadequate analgesia.  Reason for block:procedure for pain

## 2019-05-08 NOTE — Progress Notes (Signed)
Pt comfortable, not feeling ctx  Patient Vitals for the past 24 hrs:  BP Pulse Height Weight  05/08/19 0739 - - 5\' 3"  (1.6 m) 72.1 kg  05/08/19 0729 114/70 (!) 107 - -   A&ox3 nml respirations Abd: soft, nt, gravid; efw 6lbs Cx: 1.5/50/-3; posterior; speculum placed, and balloon catheter placed; initially tried w/o stylette and did have difficulty advancing; used balloon catheter with stylette and able to confirm good placement; pt tolerated well, balloon inflated to total of 60cc and speculum and stylette removed LE: no edema, nt bilat  Fht: 130s, nml variability, +accels, no decels Toco: q4 min, irregular  A/P: G1 at 39.2 wga 1. IOL - see H&P by primary provider (Mody), AC 1%; contin with pitocin 2. gbs neg 3. Fetal status reassuring

## 2019-05-08 NOTE — H&P (Signed)
Dawn Johnson is a 29 y.o. female MF, G1, presenting for IOL at 39.2 wks for poor fetal growth.  Uncomplicated pregnancy, dating by 1st trim sono, PNCare from 7 wks. But S<D at 35 wks, fetal growth at 36.4 wks- EFW 5'3" at 11% and AC 1%, FL 1%. Normal anatomy, NIPS nl, XX.   OB History    Gravida  1   Para  0   Term  0   Preterm  0   AB  0   Living  0     SAB  0   TAB  0   Ectopic  0   Multiple  0   Live Births  0          History reviewed. No pertinent past medical history. History reviewed. No pertinent surgical history. Family History: family history includes Breast cancer in her paternal grandmother; Hypertension in her father; Thyroid disease in her mother. Social History:  reports that she has never smoked. She has never used smokeless tobacco. She reports current alcohol use of about 1.0 standard drinks of alcohol per week. She reports that she does not use drugs.     Maternal Diabetes: No Genetic Screening: Normal NIPS and AFP1  Maternal Ultrasounds/Referrals: Normal anatomy. But S<D, poor growth diagnosed at 36 wks, at 11% with AC at 1%, NST/BPP and Dopplers nl since then, wkly Fetal Ultrasounds or other Referrals:  None Maternal Substance Abuse:  No Significant Maternal Medications:  None Significant Maternal Lab Results:  Group B Strep negative Other Comments:  None  Review of Systems History Dilation: 1.5 Effacement (%): 60 Station: -2 Exam by:: m wilkins rnc Blood pressure 109/62, pulse 83, temperature 98.3 F (36.8 C), height 5\' 3"  (1.6 m), weight 72.1 kg, last menstrual period 08/06/2018. Exam Physical Exam  Physical exam:  A&O x 3, no acute distress. Pleasant HEENT neg, no thyromegaly Lungs CTA bilat CV RRR, S1S2 normal Abdo soft, non tender, non acute Extr no edema/ tenderness Pelvic Cx change. Cervical Foley fell out an hour back. Now Cx 5/70%/ -2/ Vx. AROM, small amount of clear fluid  FHT 130s + accels no decels mod variab- cat  I Toco regular q 3 min, pitocin   Prenatal labs: ABO, Rh: --/--/B POS, B POS Performed at [facility], [address], [place], [place]  [phone] (89/21) Antibody: NEG (03/29 0735) Rubella: Immune (08/06 0000) RPR: NON REACTIVE (03/29 0800)  HBsAg: Negative (08/06 0000)  HIV: Non-reactive (08/06 0000)  GBS: Negative/-- (03/03 0000)  Glucola nl NIPS nl, XX Carrier screen/ Horizon 4 neg  Assessment/Plan: 29 yo G1, 39.2 wks, IOL for poor fetal growth diagnosed at 36.4 wks with nl UAD.  Continue pitocin per protocol, GBS(-) EFW 6 lbs. Anticipate SVD   26 05/08/2019, 2:20 PM

## 2019-05-09 ENCOUNTER — Encounter (HOSPITAL_COMMUNITY): Payer: Self-pay | Admitting: Obstetrics & Gynecology

## 2019-05-09 LAB — CBC
HCT: 34.4 % — ABNORMAL LOW (ref 36.0–46.0)
Hemoglobin: 11.8 g/dL — ABNORMAL LOW (ref 12.0–15.0)
MCH: 31.5 pg (ref 26.0–34.0)
MCHC: 34.3 g/dL (ref 30.0–36.0)
MCV: 91.7 fL (ref 80.0–100.0)
Platelets: 232 10*3/uL (ref 150–400)
RBC: 3.75 MIL/uL — ABNORMAL LOW (ref 3.87–5.11)
RDW: 13.2 % (ref 11.5–15.5)
WBC: 19.7 10*3/uL — ABNORMAL HIGH (ref 4.0–10.5)
nRBC: 0 % (ref 0.0–0.2)

## 2019-05-09 MED ORDER — SENNOSIDES-DOCUSATE SODIUM 8.6-50 MG PO TABS
2.0000 | ORAL_TABLET | ORAL | Status: DC
  Administered 2019-05-09 – 2019-05-10 (×2): 2 via ORAL
  Filled 2019-05-09 (×2): qty 2

## 2019-05-09 MED ORDER — DIBUCAINE (PERIANAL) 1 % EX OINT: 1.0000 "application " | TOPICAL_OINTMENT | CUTANEOUS | Status: DC | PRN

## 2019-05-09 MED ORDER — ONDANSETRON HCL 4 MG PO TABS: 4.0000 mg | ORAL_TABLET | ORAL | Status: DC | PRN

## 2019-05-09 MED ORDER — TETANUS-DIPHTH-ACELL PERTUSSIS 5-2.5-18.5 LF-MCG/0.5 IM SUSP: 0.5000 mL | Freq: Once | INTRAMUSCULAR | Status: DC

## 2019-05-09 MED ORDER — COCONUT OIL OIL
1.0000 "application " | TOPICAL_OIL | Status: DC | PRN
  Administered 2019-05-09: 1 via TOPICAL

## 2019-05-09 MED ORDER — IBUPROFEN 600 MG PO TABS
600.0000 mg | ORAL_TABLET | Freq: Four times a day (QID) | ORAL | Status: DC
  Administered 2019-05-09 – 2019-05-11 (×10): 600 mg via ORAL
  Filled 2019-05-09 (×10): qty 1

## 2019-05-09 MED ORDER — BENZOCAINE-MENTHOL 20-0.5 % EX AERO: 1.0000 "application " | INHALATION_SPRAY | CUTANEOUS | Status: DC | PRN

## 2019-05-09 MED ORDER — ACETAMINOPHEN 325 MG PO TABS: 650.0000 mg | ORAL_TABLET | ORAL | Status: DC | PRN

## 2019-05-09 MED ORDER — PRENATAL MULTIVITAMIN CH
1.0000 | ORAL_TABLET | Freq: Every day | ORAL | Status: DC
  Administered 2019-05-09 – 2019-05-11 (×3): 1 via ORAL
  Filled 2019-05-09 (×3): qty 1

## 2019-05-09 MED ORDER — SIMETHICONE 80 MG PO CHEW: 80.0000 mg | CHEWABLE_TABLET | ORAL | Status: DC | PRN

## 2019-05-09 MED ORDER — ONDANSETRON HCL 4 MG/2ML IJ SOLN: 4.0000 mg | INTRAMUSCULAR | Status: DC | PRN

## 2019-05-09 MED ORDER — ZOLPIDEM TARTRATE 5 MG PO TABS: 5.0000 mg | ORAL_TABLET | Freq: Every evening | ORAL | Status: DC | PRN

## 2019-05-09 MED ORDER — WITCH HAZEL-GLYCERIN EX PADS: 1.0000 "application " | MEDICATED_PAD | CUTANEOUS | Status: DC | PRN

## 2019-05-09 MED ORDER — DIPHENHYDRAMINE HCL 25 MG PO CAPS: 25.0000 mg | ORAL_CAPSULE | Freq: Four times a day (QID) | ORAL | Status: DC | PRN

## 2019-05-09 NOTE — Progress Notes (Addendum)
PPD #0 SVD, left mediolateral episiotomy, baby girl "Dawn Johnson" Pt. Using husband for some interpretation   S:  Reports feeling well, but tired; husband reports they haven't slept since delivery.              Tolerating po/ No nausea or vomiting / Denies dizziness or SOB             Bleeding is light             Pain controlled with Motrin             Up ad lib / ambulatory / voiding QS without difficulty   Newborn breast feeding - spoon feeding then latching; reports latching well and good colostrum   O:               VS: BP 107/75 (BP Location: Left Arm)   Pulse 75   Temp 98.7 F (37.1 C) (Oral)   Resp 18   Ht 5\' 3"  (1.6 m)   Wt 72.1 kg   LMP 08/06/2018 (Exact Date)   SpO2 98%   Breastfeeding Unknown   BMI 28.17 kg/m    LABS:              Recent Labs    05/08/19 0800 05/09/19 0611  WBC 12.1* 19.7*  HGB 13.3 11.8*  PLT 242 232               Blood type: --/--/B POS, B POS Performed at [facility], [address], [place], [place]  [phone] (74/08)  Rubella: Immune (08/06 0000)                     I&O: Intake/Output      03/29 0701 - 03/30 0700 03/30 0701 - 03/31 0700   I.V. (mL/kg) 0 (0)    Other 0    Total Intake(mL/kg) 0 (0)    Urine (mL/kg/hr) 2000 (1.2)    Blood 550    Total Output 2550    Net -2550         Urine Occurrence 1 x                  Physical Exam:             Alert and oriented X3  Lungs: Clear and unlabored  Heart: regular rate and rhythm / no murmurs  Abdomen: soft, non-tender, non-distended              Fundus: firm, non-tender, U-2  Perineum: well approximated left mediolateral episiotomy, mild edema and erythema, no evidence of hematoma  Lochia: small rubra on pad   Extremities: no edema, no calf pain or tenderness    A: PPD # 0, SVD  Left mediolateral episiotomy  Mild ABL Anemia    Doing well - stable status  P: Routine post partum orders  Lactation support  Encouraged to rest when baby rests  D/C IV and may  shower  Anticipate d/c home tomorrow   4/31, MSN, CNM [facility]

## 2019-05-09 NOTE — Lactation Note (Signed)
This note was copied from a baby's chart. Lactation Consultation Note  Patient Name: Dawn Johnson Date: 05/09/2019 Reason for consult: Initial assessment;1st time breastfeeding;Term;Infant < 6lbs P1 4 hour term female infant less than 6 lbs. Dad signed consent form to be used as interpreter for mom family speaks [place].  Infant had emesis (mocous) after feeding at breast infant was burped and swaddled. Per mom, this is her 3rd time latching infant at breast, she experienced pain in L&D with first latch which was 5 minutes, LC noticed redness on left breast where infant had latched earlier. Mom latched infant on left breast her choice using the football hold position, infant latched with wide mouth, nose and chin touching breast and breastfeed for 15 minutes, mom felt tug breast is a little sore from previous latch. Mom hand expressed afterwards and infant was given 5 mls of EBM by spoon. Mom prefers not to use DEBP but wants to use hand pump, mom easily pumped additional 5 mls of colostrum, she offer to infant after she latches infant at breast for next feeding. LC will alert RN to give mom coconut oil for breast soreness. Mom will continue to work on infant latching at breast. Mom will breastfeed infant according to hunger cues, on demand and not exceed 3 hours without breastfeeding infant. Mom knows to call RN or LC if she needs assistance with latching infant at breast. Reviewed Baby & Me book's Breastfeeding Basics.  Mom made aware of O/P services, breastfeeding support groups, community resources, and our phone # for post-discharge questions.   Maternal Data Formula Feeding for Exclusion: No Has patient been taught Hand Expression?: Yes Does the patient have breastfeeding experience prior to this delivery?: No  Feeding Feeding Type: Breast Fed  LATCH Score Latch: Grasps breast easily, tongue down, lips flanged, rhythmical sucking.  Audible Swallowing: A few  with stimulation  Type of Nipple: Everted at rest and after stimulation(left nipple is red due poor latch earlier in L&D)           Interventions Interventions: Breast feeding basics reviewed;Breast compression;Adjust position;Assisted with latch;Skin to skin;Support pillows;Position options;Breast massage;Hand express;Expressed milk;Hand pump  Lactation Tools Discussed/Used Tools: Pump Breast pump type: Manual WIC Program: No Pump Review: Setup, frequency, and cleaning;Milk Storage Initiated by:: Danelle Earthly, IBCLC   Consult Status Consult Status: Follow-up Date: 05/09/19 Follow-up type: In-patient    Danelle Earthly 05/09/2019, 6:17 AM

## 2019-05-09 NOTE — Anesthesia Postprocedure Evaluation (Signed)
Anesthesia Post Note  Patient: Architect  Procedure(s) Performed: AN AD HOC LABOR EPIDURAL     Patient location during evaluation: Mother Baby Anesthesia Type: Epidural Level of consciousness: awake and alert Pain management: pain level controlled Vital Signs Assessment: post-procedure vital signs reviewed and stable Respiratory status: spontaneous breathing, nonlabored ventilation and respiratory function stable Cardiovascular status: stable Postop Assessment: no headache, no backache and epidural receding Anesthetic complications: no    Last Vitals:  Vitals:   05/09/19 0425 05/09/19 0730  BP: 111/80 107/75  Pulse: 86 75  Resp: 18 18  Temp: 37.3 C 37.1 C  SpO2: 100% 98%    Last Pain:  Vitals:   05/09/19 0730  TempSrc: Oral  PainSc: 0-No pain   Pain Goal:                   Mancel Lardizabal

## 2019-05-10 NOTE — Lactation Note (Signed)
This note was copied from a baby's chart. Lactation Consultation Note  Patient Name: Dawn Johnson LKGMW'N Date: 05/10/2019 Reason for consult: Follow-up assessment;1st time breastfeeding;Primapara;Infant < 6lbs;Term;Infant weight loss  Dad signed the form to be the interpreter for [place].  Visited with mom of a 36 hours old FT < 6 lbs female who is being exclusively BF by her mother, she's a P1. Mom has been putting baby to breast on feeding cues, baby has been cluster feeding. Baby already nursing when entering the room, LC helped with latch and repositioning and noticed that mom's left nipple was already pinched.  Showed mom how to do the "sandwich hold" for a deeper latch, and switched from typical cradle to cross cradle hold. Mom stated that it felt a lot better, baby had a deeper latch, a few audible swallows noted upon breast compressions. Baby fed for a total of 25 minutes. Mom hasn't been using her hand pump but mostly doing spoon feeding and hand expressing, praised her for her efforts.   However, they haven't been supplementing baby with her EBM after every feeding. Reviewed LPI policy due to baby's birth weight, she's currently at 4% weight loss, and parents choose to stay in the hospital for another day. Encouraged parents to keep working on spoon feeding and hand expressing (LC offered to set up a DEBP but mom declined), LC also made parents aware that we have donor milk at the hospital if they're interested in offering extra calories to prevent further weight loss, mom's goal is to keep baby in an exclusive breastmilk diet.  LC assisted with spoon feeding, right after nursing, baby took 1 ml of EBM, LC also showed parents how to finger feed to avoid spilling milk.  Feeding plan:  1. Encouraged mom to feed baby STS 8-12 times/24 hours or sooner if feeding cues are present 2. Hand expression and spoon feeding were also encouraged after each feeding, every 3 hours 3.  Parent will let her RN know if they'd like to start donor milk today  Parents reported all questions and concerns were answered, they're both aware of LC OP services and will call PRN.  Maternal Data    Feeding Feeding Type: Breast Fed  LATCH Score Latch: Grasps breast easily, tongue down, lips flanged, rhythmical sucking.  Audible Swallowing: A few with stimulation  Type of Nipple: Everted at rest and after stimulation  Comfort (Breast/Nipple): Soft / non-tender  Hold (Positioning): Assistance needed to correctly position infant at breast and maintain latch.  LATCH Score: 8  Interventions Interventions: Breast feeding basics reviewed;Assisted with latch;Skin to skin;Breast massage;Hand express;Breast compression;Adjust position;Support pillows;Hand pump;Coconut oil  Lactation Tools Discussed/Used Tools: Pump;Coconut oil Breast pump type: Manual Pump Review: Setup, frequency, and cleaning Initiated by:: RN Date initiated:: 05/09/19   Consult Status Consult Status: Follow-up Date: 05/10/19 Follow-up type: In-patient    Dawn Johnson Venetia Constable 05/10/2019, 1:15 PM

## 2019-05-10 NOTE — Progress Notes (Signed)
Patient ID: Dawn Johnson, female   DOB: Oct 15, 1990, 29 y.o.   MRN: [id number] PPD # 1 S/P NSVD  Live born female  Birth Weight: 5 lb 14.7 oz (2685 g) APGAR: 9, 9  Newborn Delivery   Birth date/time: 05/09/2019 01:09:00 Delivery type: Vaginal, Spontaneous     Baby name: Zollie Scale Delivering provider: MODY, VAISHALI  Episiotomy:Left Mediolateral   Lacerations:None    Feeding: breast  Pain control at delivery: Epidural   S:  Reports feeling well, may consider going home this afternoon if baby bili levels stable             Tolerating po/ No nausea or vomiting             Bleeding is light             Pain controlled with acetaminophen and ibuprofen (OTC)             Up ad lib / ambulatory / voiding without difficulties   O:  A & O x 3, in no apparent distress              VS:  Vitals:   05/09/19 0730 05/09/19 1209 05/09/19 2106 05/10/19 0613  BP: 107/75 102/72 105/66 98/75  Pulse: 75 83 83 78  Resp: 18 20 16 18   Temp: 98.7 F (37.1 C) 98.3 F (36.8 C) 98.4 F (36.9 C) 98.4 F (36.9 C)  TempSrc: Oral Oral Oral Oral  SpO2: 98% 99% 96%   Weight:      Height:        LABS:  Recent Labs    05/08/19 0800 05/09/19 0611  WBC 12.1* 19.7*  HGB 13.3 11.8*  HCT 39.7 34.4*  PLT 242 232    Blood type: --/--/B POS, B POS Performed at [facility], [address] [place], [place]  [phone] (66/44)  Rubella: Immune (08/06 0000)   I&O: I/O last 3 completed shifts: In: 0  Out: 2550 [Urine:2000; Blood:550]          No intake/output data recorded.  Vaccines: TDaP UTD         Flu    UTD   Lungs: Clear and unlabored  Heart: regular rate and rhythm / no murmurs  Abdomen: soft, non-tender, non-distended             Fundus: firm, non-tender, U-1  Perineum: repair intact, no edema  Lochia: small  Extremities: no edema, no calf pain or tenderness    A/P: PPD # 1 28 y.o., G1P1001   Principal Problem:   Postpartum care following vaginal delivery 3/30 Active  Problems:   Encounter for planned induction of labor   SVD (spontaneous vaginal delivery)   Obstetrical laceration - R ML episiotomy   Doing well - stable status  Routine post partum orders  Anticipate discharge this PM or tomorrow    4/30, MSN, CNM 05/10/2019, 9:04 AM

## 2019-05-11 MED ORDER — IBUPROFEN 600 MG PO TABS: 600.0000 mg | ORAL_TABLET | Freq: Four times a day (QID) | ORAL | 0 refills | Status: DC

## 2019-05-11 NOTE — Lactation Note (Signed)
This note was copied from a baby's chart. Lactation Consultation Note  Patient Name: Girl Asmaa Tirpak MVEHM'C Date: 05/11/2019 Reason for consult: Follow-up assessment;Primapara;1st time breastfeeding;Infant weight loss;Other (Comment);Term;Nipple pain/trauma(increased Bili - per dad changing to a baby patient)  2nd LC visit to assess a feeding and to add the 5 F SNS at the breast.  LC checked and changed a wet diaper ( med - large and loose yellow brown stool)  Baby awake, assisted to latch on the less sore breast ( right ) football and depth achieved easily. ( wide open mouth and flanged lips ).  LC showed dad how to insert the 5 F SNS in the corner of the mouth after the  Baby was latched and baby tolerated well. Baby stayed in consistent swallowing pattern and fed for 10 mins and took 24 ml of formula.  Baby satisfied and mom and plans to pump both breast with DEBP for 15 -20 mins .  Mom aware to save the milk for the next feeding.   mom feels comfortable with set up of the DEBP and settings .  LC provided report for the [facility] and made her aware dad probably will need assistance with 5 F SNS at the breast.  Both mom and dad expressed happiness the baby latched and baby was supplemented at the breast and not a bottle.     Maternal Data Has patient been taught Hand Expression?: Yes  Feeding Feeding Type: Breast Milk with Formula added  LATCH Score Latch: Grasps breast easily, tongue down, lips flanged, rhythmical sucking.  Audible Swallowing: Spontaneous and intermittent  Type of Nipple: Everted at rest and after stimulation  Comfort (Breast/Nipple): Filling, red/small blisters or bruises, mild/mod discomfort  Hold (Positioning): Assistance needed to correctly position infant at breast and maintain latch.  LATCH Score: 8  Interventions Interventions: Breast feeding basics reviewed;Assisted with latch;Skin to skin;Breast massage;Hand express;Breast  compression;Adjust position;Support pillows;Position options;Expressed milk;Coconut oil;Shells;Comfort gels;Hand pump;DEBP  Lactation Tools Discussed/Used Tools: Shells;Pump;Flanges;Comfort gels;Coconut oil;23F feeding tube / Syringe;Supplemental Nutrition System Flange Size: 24 Shell Type: Inverted Breast pump type: Manual;Double-Electric Breast Pump Pump Review: Setup, frequency, and cleaning;Milk Storage   Consult Status Consult Status: Follow-up Date: 05/12/19 Follow-up type: In-patient    Matilde Sprang Havish Petties 05/11/2019, 10:38 AM

## 2019-05-11 NOTE — Lactation Note (Signed)
This note was copied from a baby's chart. Lactation Consultation Note  Patient Name: Dawn Johnson [id number] Date: 05/11/2019 Reason for consult: Follow-up assessment;Primapara;1st time breastfeeding;Infant weight loss;Other (Comment);Nipple pain/trauma;Term(8 % weight loss , @ 52 hours Bili 12.4 , milk is coming in/ mom and dad aware to call to be shown how to use the 5 F SNS at the breast)  Baby is 85 hours old  LC reviewed and updated the doc flow per dad .  Mom and dad expressed concerns of the 8 % weight loss and LC explained its normal, also the voids and stools correlate with the weight loss.  LC recommended prior to latching , breast massage, hand express, prepump  And latch 1st with firm support.  LC recommended not going home supplementing with the curved tip syringe.  LC recommended calling for LC next feeding so the mom and dad can be shown how to use the 92F SNS after the baby being latched, option 2 would be the artifical nipple.  Parents expressed they preferred not using the bottle.  [facility] Heather Price aware of LC recommended plan and parents are to call for the next feeding.  Per mom the left nipple sore, LC assessed with moms permission and noted a intact  Positional strip, no redness, breast warm and filling.  LC reviewed hand expressing/ large drop  and encouraged mom to apply the EBM to nipples liberally.  Sore nipple and engorgement prevention and tx reviewed.  Mom has a hand pump , DEBP kit and a DEBP at home.  Since the DEBP was set up mom has pumped x 1 5 ml .    Maternal Data Has patient been taught Hand Expression?: Yes  Feeding Feeding Type: (per dad baby last fed at 7 am)  LATCH Score                   Interventions Interventions: Breast feeding basics reviewed;Shells;Comfort gels;Hand pump;DEBP  Lactation Tools Discussed/Used Tools: Shells;Pump;Comfort gels Shell Type: Inverted Breast pump type: Double-Electric Breast  Pump;Manual   Consult Status Consult Status: Follow-up Date: 05/11/19 Follow-up type: In-patient    [place] 05/11/2019, 8:30 AM

## 2019-05-11 NOTE — Discharge Summary (Signed)
Obstetric Discharge Summary   Patient Name: Dawn Johnson DOB: 1990/10/31 MRN: [id number]  Date of Admission: 05/08/2019 Date of Discharge: 05/11/2019 Date of Delivery: 05/09/2019 Gestational Age at Delivery: [redacted]w[redacted]d  Primary OB: [facility] - Dr. Benjie Karvonen  Antepartum complications:  - Poor fetal growth Prenatal Labs:  ABO, Rh: --/--/B POS, B POS Performed at [facility], [place] [address] [place], [place] [address]  [phone]/29 0735) Antibody: NEG (03/29 0735) Rubella: Immune (08/06 0000) RPR: NON REACTIVE (03/29 0800)  HBsAg: Negative (08/06 0000)  HIV: Non-reactive (08/06 0000)  GBS: Negative/-- (03/03 0000)  Glucola nl NIPS nl, XX Carrier screen/ Horizon 4 neg Admitting Diagnosis: IOL at 39+2 weeks for poor fetal growth  Secondary Diagnoses: Patient Active Problem List   Diagnosis Date Noted  . SVD (spontaneous vaginal delivery) 05/09/2019  . Postpartum care following vaginal delivery 3/30 05/09/2019  . Obstetrical laceration - R ML episiotomy 05/09/2019  . Encounter for planned induction of labor 05/08/2019  . Left breast mass 05/16/2018    Augmentation: AROM, Pitocin and Foley Balloon Complications: None  Date of Delivery: 05/09/2019 Delivered By: Dr. Benjie Karvonen Delivery Type: spontaneous vaginal delivery Anesthesia: epidural Placenta: spontaneous Laceration: left mediolateral episiotomy  Episiotomy: none  Newborn Data: Live born female  Birth Weight: 5 lb 14.7 oz (2685 g) APGAR: 9, 9  Newborn Delivery   Birth date/time: 05/09/2019 01:09:00 Delivery type: Vaginal, Spontaneous         Hospital/Postpartum Course  (Vaginal Delivery): pt. Admitted for induction of labor at 39+2 weeks for poor fetal growth.  She was induced with foley bulb and Pitocin and augmented with AROM.  See notes and delivery summary for details. Patient had an uncomplicated postpartum course.  By time of discharge on PPD#2, her pain was controlled on oral pain medications; she had  appropriate lochia and was ambulating, voiding without difficulty and tolerating regular diet.  She was deemed stable for discharge to home.      Labs: CBC Latest Ref Rng & Units 05/09/2019 05/08/2019 10/04/2017  WBC 4.0 - 10.5 K/uL 19.7(H) 12.1(H) 10.7  Hemoglobin 12.0 - 15.0 g/dL 11.8(L) 13.3 14.1  Hematocrit 36.0 - 46.0 % 34.4(L) 39.7 41.1  Platelets 150 - 400 K/uL 825 053 976   Conflict (See Lab Report): B POS/B POS Performed at [facility], [place] [address] [place], [place] [address]   Physical exam:  BP 103/76 (BP Location: Right Arm)   Pulse 69   Temp 98.1 F (36.7 C) (Oral)   Resp 18   Ht 5\' 3"  (1.6 m)   Wt 72.1 kg   LMP 08/06/2018 (Exact Date)   SpO2 99%   Breastfeeding Unknown   BMI 28.17 kg/m  General: alert and no distress Pulm: normal respiratory effort Lochia: appropriate Abdomen: soft, NT Uterine Fundus: firm, below umbilicus Perineum: healing well, no significant erythema, no significant edema Incision: c/d/i, healing well, no significant drainage, no dehiscence, no significant erythema Extremities: No evidence of DVT seen on physical exam. No lower extremity edema.   Disposition: stable, discharge to home Baby Feeding: breast milk and formula Baby Disposition: baby patient, mom to room in tonight  Rh Immune globulin given: N/A Rubella vaccine given: N/A Tdap vaccine given in AP or PP setting: UTD Flu vaccine given in AP or PP setting: UTD   Plan:  Dawn Johnson was discharged to home in good condition. Follow-up appointment at [facility] in 6 weeks.  Discharge Instructions: Per After Visit Summary. Refer to After Visit Summary  and [facility] discharge booklet  Activity: Advance as tolerated. Pelvic rest for 6 weeks.   Diet: Regular, Heart Healthy Discharge Medications: Allergies as of 05/11/2019   No Known Allergies     Medication List    TAKE these medications   ibuprofen 600 MG tablet Commonly known as: ADVIL Take 1  tablet (600 mg total) by mouth every 6 (six) hours.   prenatal multivitamin Tabs tablet Take 1 tablet by mouth daily at 12 noon.            Discharge Care Instructions  (From admission, onward)         Start     Ordered   05/11/19 0000  Discharge wound care:    Comments: Warm water sitz baths 2-3 times per day as needed   05/11/19 1342         Outpatient follow up:  Follow-up Information    Shea Evans, MD. Schedule an appointment as soon as possible for a visit in 6 week(s).   Specialty: Obstetrics and Gynecology Why: Postpartum visit  Contact information: [address] [place] [address] [phone]           Signed:  Carlean Jews, MSN, CNM [facility]

## 2019-05-11 NOTE — Lactation Note (Signed)
This note was copied from a baby's chart. Lactation Consultation Note  Patient Name: Dawn Johnson [id number] Date: 05/11/2019 Reason for consult: Follow-up assessment  3 rd visit for today .  Mom and dad requested assist with SNS .  As LC entered the room dad mentioned he was able to use the the SNS with  Baby and she took 24 ml ( 3 ml of EBM and 21 of formula )  LC praised mom and dad for working as a team.  Baby released / nipple well rounded and after 5 mins baby rooting.  LC assisted to latch on the same breast due the left being sore and  Baby latched with depth and fed for 7 mins and was latched non- nutritive  And LC had mom release suction. Baby fells asleep after burping.  LC reminded mom to post pump after very feeding for 15 -20  mins and reviewed the settings on the pump / mom and dad had questions.  Left nipple still to sore to latch and per mom shells are working.  LC gave the [facility] RN report on this consult.   Maternal Data Has patient been taught Hand Expression?: Yes  Feeding Feeding Type: Breast Fed  LATCH Score Latch: Grasps breast easily, tongue down, lips flanged, rhythmical sucking.  Audible Swallowing: Spontaneous and intermittent  Type of Nipple: Everted at rest and after stimulation  Comfort (Breast/Nipple): Filling, red/small blisters or bruises, mild/mod discomfort  Hold (Positioning): Assistance needed to correctly position infant at breast and maintain latch.  LATCH Score: 8  Interventions Interventions: Breast feeding basics reviewed;Assisted with latch;Skin to skin;Breast massage;Hand express;Breast compression;Adjust position;Support pillows  Lactation Tools Discussed/Used Tools: Shells;Pump;Flanges;Coconut oil;Comfort gels;41F feeding tube / Syringe Flange Size: 24 Shell Type: Inverted Breast pump type: Double-Electric Breast Pump;Manual Pump Review: Setup, frequency, and cleaning;Milk Storage   Consult Status Consult Status:  Follow-up Date: 05/12/19 Follow-up type: In-patient    Matilde Sprang Anthonie Lotito 05/11/2019, 1:55 PM

## 2019-05-12 ENCOUNTER — Ambulatory Visit: Payer: Self-pay

## 2019-05-12 NOTE — Lactation Note (Signed)
This note was copied from a baby's chart. Lactation Consultation Note  Patient Name: Dawn Johnson [id number] Date: 05/12/2019 Reason for consult: Follow-up assessment;Primapara;1st time breastfeeding;Term;Infant weight loss;Other (Comment)(increased weight) Baby is 17 hours old , increased weight , Bili check 13.8 @ 76  As LC entered the room baby latched with depth and mom comfortable.  Baby fed 30 mins, swallows noted and nipple well rounded when released.  Mom denies soreness today, nipple ( right appeared healed) , and breast are fuller.  Mom mentioned she pumped off 25 ml.  Sore nipple and engorgement prevention and tx reviewed.  [place] praised mom for her breast feeding and pumping and dad for is support.  Mom has hand pump , shells , comfort gels, DEBP Kit, 5 F SNS if needed.  pe rmom has a DEBP at home and is aware to add extra pumping after feedings  Until the weight is back to birth weight, gaining steadily, and can stay awake for majority of feeding.  LC stressed the importance of STS feedings.  Mom and dad aware of the [facility] resources.     Maternal Data    Feeding Feeding Type: (baby latched)  LATCH Score Latch: (latched with depth)  Audible Swallowing: (increased swallows)  Type of Nipple: (nipple well rounded when the baby released)  Comfort (Breast/Nipple): (mom comfortable and milk is in)  Hold (Positioning): (mom latched)     Interventions Interventions: Breast feeding basics reviewed  Lactation Tools Discussed/Used Pump Review: Milk Storage   Consult Status Consult Status: Complete Date: 05/12/19    Myer Haff 05/12/2019, 11:36 AM

## 2019-06-22 DIAGNOSIS — Z1151 Encounter for screening for human papillomavirus (HPV): Secondary | ICD-10-CM | POA: Diagnosis not present

## 2019-06-22 DIAGNOSIS — Z124 Encounter for screening for malignant neoplasm of cervix: Secondary | ICD-10-CM | POA: Diagnosis not present

## 2019-06-22 DIAGNOSIS — Z30430 Encounter for insertion of intrauterine contraceptive device: Secondary | ICD-10-CM | POA: Diagnosis not present

## 2019-07-05 ENCOUNTER — Telehealth: Payer: Self-pay

## 2019-07-05 NOTE — Telephone Encounter (Signed)
I would be happy to check her IUD at the time of her annual exam.

## 2019-07-05 NOTE — Telephone Encounter (Addendum)
Dr. Edward Jolly -if postpartum f/u completed, ok to schedule IUD check and AEX in same appt?

## 2019-07-05 NOTE — Telephone Encounter (Signed)
Patients husband is calling in regards to IUD check. Patients husband states she received IUD from Dr. Darlyn Chamber office and states she needs a check up here. Patient also would like an annual done the same day.

## 2019-07-06 NOTE — Telephone Encounter (Signed)
Left message to call Codi Kertz, RN at [facility] [phone].   

## 2019-07-06 NOTE — Telephone Encounter (Signed)
Spoke with patient patients spouse, Arvilla Market, ok per dpr. Reports patient has been cleared by OB, can f/u with Dr. Edward Jolly for AEX and IUD check. No GYN concerns.   AEX scheduled for 07/17/19 at 11am.  Covid 19 precautions reviewed.   Routing to provider for final review.  Will close encounter.

## 2019-07-14 ENCOUNTER — Telehealth (HOSPITAL_COMMUNITY): Payer: Self-pay

## 2019-07-14 NOTE — Progress Notes (Deleted)
29 y.o. G64P1001 Married [place] female here for annual exam.    PCP:     Patient's last menstrual period was 08/06/2018 (exact date).           Sexually active: {yes no:314532}  The current method of family planning is {contraception:315051}.    Exercising: {yes no:314532}  {types:19826} Smoker:  no  Health Maintenance: Pap: 2017 normal per patient  History of abnormal Pap:  {YES NO:22349} MMG: 06-28-18 Lt.Br.U/S normal/BiRads1 Colonoscopy: *** BMD: N/A  Result  N/A TDaP: UTD per patient Gardasil:  Yes, completed HIV:*** Hep C: never Screening Labs:  Hb today: ***, Urine today: ***   reports that she has never smoked. She has never used smokeless tobacco. She reports current alcohol use of about 1.0 standard drinks of alcohol per week. She reports that she does not use drugs.  No past medical history on file.  No past surgical history on file.  Current Outpatient Medications  Medication Sig Dispense Refill  . ibuprofen (ADVIL) 600 MG tablet Take 1 tablet (600 mg total) by mouth every 6 (six) hours. 30 tablet 0  . Prenatal Vit-Fe Fumarate-FA (PRENATAL MULTIVITAMIN) TABS tablet Take 1 tablet by mouth daily at 12 noon.     No current facility-administered medications for this visit.    Family History  Problem Relation Age of Onset  . Thyroid disease Mother   . Hypertension Father   . Breast cancer Paternal Grandmother        unsure age of onset    Review of Systems  Exam:   LMP 08/06/2018 (Exact Date)     General appearance: alert, cooperative and appears stated age Head: normocephalic, without obvious abnormality, atraumatic Neck: no adenopathy, supple, symmetrical, trachea midline and thyroid normal to inspection and palpation Lungs: clear to auscultation bilaterally Breasts: normal appearance, no masses or tenderness, No nipple retraction or dimpling, No nipple discharge or bleeding, No axillary adenopathy Heart: regular rate and rhythm Abdomen: soft,  non-tender; no masses, no organomegaly Extremities: extremities normal, atraumatic, no cyanosis or edema Skin: skin color, texture, turgor normal. No rashes or lesions Lymph nodes: cervical, supraclavicular, and axillary nodes normal. Neurologic: grossly normal  Pelvic: External genitalia:  no lesions              No abnormal inguinal nodes palpated.              Urethra:  normal appearing urethra with no masses, tenderness or lesions              Bartholins and Skenes: normal                 Vagina: normal appearing vagina with normal color and discharge, no lesions              Cervix: no lesions              Pap taken: {yes no:314532} Bimanual Exam:  Uterus:  normal size, contour, position, consistency, mobility, non-tender              Adnexa: no mass, fullness, tenderness              Rectal exam: {yes no:314532}.  Confirms.              Anus:  normal sphincter tone, no lesions  Chaperone was present for exam.  Assessment:   Well woman visit with normal exam.   Plan: Mammogram screening discussed. Self breast awareness reviewed. Pap and HR HPV as above. Guidelines for  Calcium, Vitamin D, regular exercise program including cardiovascular and weight bearing exercise.   Follow up annually and prn.   Additional counseling given.  {yes [id number]. _______ minutes face to face time of which over 50% was spent in counseling.    After visit summary provided.

## 2019-07-14 NOTE — Telephone Encounter (Signed)
Significant other of Ms. Dawn Johnson called to discuss baby's recent changes with breast feeding. Ms. Dawn Johnson speaks [place] and he spoke on her behalf as interpretor. They had a recent family emergency that has caused worry and stress. Ms. Dawn Johnson is concerned her milk supply has dropped and baby is fussing at the breast and refusing it. [name] is now 2 months old and EBF until about 2 days ago. Ms. Dawn Johnson is pumping a bit to make sure baby eats. Recommended that they put baby to breast more frequently, continue to add in additional pumps for supplementation and call back tomorrow to follow up. Also discussed setting up an OP appointment and I gave support person the information.

## 2019-07-17 ENCOUNTER — Ambulatory Visit: Payer: BC Managed Care – PPO | Admitting: Obstetrics and Gynecology

## 2019-07-17 ENCOUNTER — Other Ambulatory Visit: Payer: Self-pay

## 2019-07-24 ENCOUNTER — Other Ambulatory Visit: Payer: Self-pay

## 2019-07-25 ENCOUNTER — Other Ambulatory Visit (HOSPITAL_COMMUNITY)
Admission: RE | Admit: 2019-07-25 | Discharge: 2019-07-25 | Disposition: A | Discharge status: Home / Self Care | Payer: BC Managed Care – PPO | Source: Ambulatory Visit | Attending: Obstetrics and Gynecology | Admitting: Obstetrics and Gynecology

## 2019-07-25 ENCOUNTER — Encounter: Payer: Self-pay | Admitting: Obstetrics and Gynecology

## 2019-07-25 ENCOUNTER — Ambulatory Visit: Payer: BC Managed Care – PPO | Admitting: Obstetrics and Gynecology

## 2019-07-25 VITALS — BP 100/62 | HR 64 | Temp 97.2°F | Resp 16 | Ht 62.75 in | Wt 132.2 lb

## 2019-07-25 DIAGNOSIS — Z01419 Encounter for gynecological examination (general) (routine) without abnormal findings: Secondary | ICD-10-CM | POA: Insufficient documentation

## 2019-07-25 NOTE — Progress Notes (Addendum)
29 y.o. G48P1001 Married [place] female here for annual exam.    Patient had baby girl 2 months ago. Breast feeding her baby.   She has a hx of left breast lump.  She had an [place] last year.   Took her Covid vaccine.   PCP: None    Patient's last menstrual period was 08/06/2018 (exact date).     Period Cycle (Days):  (no cycle since delivery of baby 05-10-19)     Sexually active: Yes.    The current method of family planning is IUD--5-mid-21 Paragard.    Exercising: Yes.    20 minute postpartum routine Smoker:  no  Health Maintenance: Pap: 2017 normal per patient History of abnormal Pap:  no MMG:  n/a Colonoscopy:  n/a BMD:   n/a  Result  n/a TDaP:  2020 Gardasil:   Yes, completed HIV: 2020 Neg in pregnancy--Unsure Hep C:  --- Screening Labs:  ---   reports that she has never smoked. She has never used smokeless tobacco. She reports previous alcohol use. She reports that she does not use drugs.  History reviewed. No pertinent past medical history.  History reviewed. No pertinent surgical history.  Current Outpatient Medications  Medication Sig Dispense Refill  . Prenatal Vit-Fe Fumarate-FA (PRENATAL MULTIVITAMIN) TABS tablet Take 1 tablet by mouth daily at 12 noon.     No current facility-administered medications for this visit.    Family History  Problem Relation Age of Onset  . Thyroid disease Mother   . Hypertension Father   . Breast cancer Paternal Grandmother        unsure age of onset    Review of Systems  All other systems reviewed and are negative.   Exam:   BP 100/62   Pulse 64   Temp (!) 97.2 F (36.2 C) (Temporal)   Resp 16   Ht 5' 2.75" (1.594 m)   Wt 132 lb 3.2 oz (60 kg)   LMP 08/06/2018 (Exact Date)   Breastfeeding Yes   BMI 23.61 kg/m     General appearance: alert, cooperative and appears stated age Head: normocephalic, without obvious abnormality, atraumatic Neck: no adenopathy, supple, symmetrical, trachea midline and thyroid  normal to inspection and palpation Lungs: clear to auscultation bilaterally Breasts: normal appearance, no masses or tenderness, No nipple retraction or dimpling, No nipple discharge or bleeding, No axillary adenopathy Heart: regular rate and rhythm Abdomen: soft, non-tender; no masses, no organomegaly Extremities: extremities normal, atraumatic, no cyanosis or edema Skin: skin color, texture, turgor normal. No rashes or lesions Lymph nodes: cervical, supraclavicular, and axillary nodes normal. Neurologic: grossly normal  Pelvic: External genitalia:  no lesions              No abnormal inguinal nodes palpated.              Urethra:  normal appearing urethra with no masses, tenderness or lesions              Bartholins and Skenes: normal                 Vagina: normal appearing vagina with normal color and discharge, no lesions              Cervix: no lesions.  IUD strings noted.              Pap taken: Yes.   Bimanual Exam:  Uterus:  normal size, contour, position, consistency, mobility, non-tender  Adnexa: no mass, fullness, tenderness           Chaperone was present for exam.  Assessment:   Well woman visit with normal exam. Status post NSVD. Paragard IUD.   Plan: Mammogram screening discussed. Self breast awareness reviewed. Pap and HR HPV as above. Guidelines for Calcium, Vitamin D, regular exercise program including cardiovascular and weight bearing exercise.   Follow up annually and prn.   After visit summary provided.

## 2019-07-25 NOTE — Patient Instructions (Signed)

## 2019-07-27 LAB — CYTOLOGY - PAP
Comment: NEGATIVE
Diagnosis: NEGATIVE
High risk HPV: NEGATIVE

## 2019-08-14 DIAGNOSIS — Z20822 Contact with and (suspected) exposure to covid-19: Secondary | ICD-10-CM | POA: Diagnosis not present

## 2019-11-07 ENCOUNTER — Other Ambulatory Visit: Payer: Self-pay

## 2019-11-07 ENCOUNTER — Ambulatory Visit (INDEPENDENT_AMBULATORY_CARE_PROVIDER_SITE_OTHER): Payer: BC Managed Care – PPO | Admitting: Medical

## 2019-11-07 VITALS — BP 127/66 | HR 74 | Resp 16 | Ht 62.0 in | Wt 133.4 lb

## 2019-11-07 DIAGNOSIS — Z0000 Encounter for general adult medical examination without abnormal findings: Secondary | ICD-10-CM | POA: Diagnosis not present

## 2019-11-07 DIAGNOSIS — Z23 Encounter for immunization: Secondary | ICD-10-CM | POA: Diagnosis not present

## 2019-11-07 DIAGNOSIS — R5383 Other fatigue: Secondary | ICD-10-CM | POA: Diagnosis not present

## 2019-11-07 NOTE — Progress Notes (Signed)
Subjective:    Patient ID: Dawn Johnson, female    DOB: January 19, 1991, 29 y.o.   MRN: [id number]  HPI  Pt in for first time.  Pt husband works for [employer]. Pt starting to exercise regularly. Started back this month. Pt had baby girl born in March. Pt is eating healthy. Not breast feeding.  Non smoker. Social alcohol use on weekends.   Pt wants to get flu vacine today.  Review of Systems  Constitutional: Negative for chills, fatigue and fever.  Respiratory: Negative for cough, chest tightness, shortness of breath and wheezing.   Cardiovascular: Negative for chest pain and palpitations.  Gastrointestinal: Negative for abdominal pain, constipation, diarrhea, nausea and vomiting.  Genitourinary: Negative for difficulty urinating and dysuria.  Musculoskeletal: Negative for back pain and neck pain.  Skin: Negative for rash.  Neurological: Negative for dizziness, seizures, speech difficulty, weakness and light-headedness.  Hematological: Negative for adenopathy. Does not bruise/bleed easily.  Psychiatric/Behavioral: Negative for behavioral problems, confusion, dysphoric mood and sleep disturbance. The patient is not nervous/anxious.     No past medical history on file.   Social History   Socioeconomic History  . Marital status: Married    Spouse name: Not on file  . Number of children: Not on file  . Years of education: Not on file  . Highest education level: Not on file  Occupational History  . Not on file  Tobacco Use  . Smoking status: Never Smoker  . Smokeless tobacco: Never Used  Vaping Use  . Vaping Use: Never used  Substance and Sexual Activity  . Alcohol use: Not Currently  . Drug use: Never  . Sexual activity: Yes    Birth control/protection: I.U.D.    Comment: paragard IUD 06/2019  Other Topics Concern  . Not on file  Social History Narrative  . Not on file   Social Determinants of Health   Financial Resource Strain:   . Difficulty of Paying Living  Expenses: Not on file  Food Insecurity:   . Worried About Programme researcher, broadcasting/film/video in the Last Year: Not on file  . Ran Out of Food in the Last Year: Not on file  Transportation Needs:   . Lack of Transportation (Medical): Not on file  . Lack of Transportation (Non-Medical): Not on file  Physical Activity:   . Days of Exercise per Week: Not on file  . Minutes of Exercise per Session: Not on file  Stress:   . Feeling of Stress : Not on file  Social Connections:   . Frequency of Communication with Friends and Family: Not on file  . Frequency of Social Gatherings with Friends and Family: Not on file  . Attends Religious Services: Not on file  . Active Member of Clubs or Organizations: Not on file  . Attends Banker Meetings: Not on file  . Marital Status: Not on file  Intimate Partner Violence:   . Fear of Current or Ex-Partner: Not on file  . Emotionally Abused: Not on file  . Physically Abused: Not on file  . Sexually Abused: Not on file    No past surgical history on file.  Family History  Problem Relation Age of Onset  . Thyroid disease Mother   . Hypertension Father   . Breast cancer Paternal Grandmother        unsure age of onset    No Known Allergies  Current Outpatient Medications on File Prior to Visit  Medication Sig Dispense Refill  . Prenatal  Vit-Fe Fumarate-FA (PRENATAL MULTIVITAMIN) TABS tablet Take 1 tablet by mouth daily at 12 noon. (Patient not taking: Reported on 11/07/2019)     No current facility-administered medications on file prior to visit.    BP 127/66   Pulse 74   Resp 16   Ht 5\' 2"  (1.575 m)   Wt 133 lb 6.4 oz (60.5 kg)   LMP 10/03/2019   SpO2 93%   BMI 24.40 kg/m       Objective:   Physical Exam  General Mental Status- Alert. General Appearance- Not in acute distress.   Skin General: Color- Normal Color. Moisture- Normal Moisture.  Neck Carotid Arteries- Normal color. Moisture- Normal Moisture. No carotid bruits. No  JVD.  Chest and Lung Exam Auscultation: Breath Sounds:-Normal.  Cardiovascular Auscultation:Rythm- Regular. Murmurs & Other Heart Sounds:Auscultation of the heart reveals- No Murmurs.  Abdomen Inspection:-Inspeection Normal. Palpation/Percussion:Note:No mass. Palpation and Percussion of the abdomen reveal- Non Tender, Non Distended + BS, no rebound or guarding.    Neurologic Cranial Nerve exam:- CN III-XII intact(No nystagmus), symmetric smile. Strength:- 5/5 equal and symmetric strength both upper and lower extremities.      Assessment & Plan:  For you wellness exam today I have ordered cbc, cmp and lipid panel.  For mild fatigue adding fatigue labs.  Flu vaccine today.  Recommend exercise and healthy diet.  We will let you know lab results as they come in.  Follow up date appointment will be determined after lab review.  10/05/2019, PA-C

## 2019-11-07 NOTE — Patient Instructions (Signed)
For you wellness exam today I have ordered cbc, cmp and lipid panel.  For mild fatigue adding fatigue labs.  Flu vaccine today.  Recommend exercise and healthy diet.  We will let you know lab results as they come in.  Follow up date appointment will be determined after lab review.    Preventive Care 69-31 Years Old, Female Preventive care refers to visits with your health care provider and lifestyle choices that can promote health and wellness. This includes:  A yearly physical exam. This may also be called an annual well check.  Regular dental visits and eye exams.  Immunizations.  Screening for certain conditions.  Healthy lifestyle choices, such as eating a healthy diet, getting regular exercise, not using drugs or products that contain nicotine and tobacco, and limiting alcohol use. What can I expect for my preventive care visit? Physical exam Your health care provider will check your:  Height and weight. This may be used to calculate body mass index (BMI), which tells if you are at a healthy weight.  Heart rate and blood pressure.  Skin for abnormal spots. Counseling Your health care provider may ask you questions about your:  Alcohol, tobacco, and drug use.  Emotional well-being.  Home and relationship well-being.  Sexual activity.  Eating habits.  Work and work Statistician.  Method of birth control.  Menstrual cycle.  Pregnancy history. What immunizations do I need?  Influenza (flu) vaccine  This is recommended every year. Tetanus, diphtheria, and pertussis (Tdap) vaccine  You may need a Td booster every 10 years. Varicella (chickenpox) vaccine  You may need this if you have not been vaccinated. Human papillomavirus (HPV) vaccine  If recommended by your health care provider, you may need three doses over 6 months. Measles, mumps, and rubella (MMR) vaccine  You may need at least one dose of MMR. You may also need a second  dose. Meningococcal conjugate (MenACWY) vaccine  One dose is recommended if you are age 31-21 years and a first-year college student living in a residence hall, or if you have one of several medical conditions. You may also need additional booster doses. Pneumococcal conjugate (PCV13) vaccine  You may need this if you have certain conditions and were not previously vaccinated. Pneumococcal polysaccharide (PPSV23) vaccine  You may need one or two doses if you smoke cigarettes or if you have certain conditions. Hepatitis A vaccine  You may need this if you have certain conditions or if you travel or work in places where you may be exposed to hepatitis A. Hepatitis B vaccine  You may need this if you have certain conditions or if you travel or work in places where you may be exposed to hepatitis B. Haemophilus influenzae type b (Hib) vaccine  You may need this if you have certain conditions. You may receive vaccines as individual doses or as more than one vaccine together in one shot (combination vaccines). Talk with your health care provider about the risks and benefits of combination vaccines. What tests do I need?  Blood tests  Lipid and cholesterol levels. These may be checked every 5 years starting at age 44.  Hepatitis C test.  Hepatitis B test. Screening  Diabetes screening. This is done by checking your blood sugar (glucose) after you have not eaten for a while (fasting).  Sexually transmitted disease (STD) testing.  BRCA-related cancer screening. This may be done if you have a family history of breast, ovarian, tubal, or peritoneal cancers.  Pelvic exam and  Pap test. This may be done every 3 years starting at age 32. Starting at age 37, this may be done every 5 years if you have a Pap test in combination with an HPV test. Talk with your health care provider about your test results, treatment options, and if necessary, the need for more tests. Follow these instructions at  home: Eating and drinking   Eat a diet that includes fresh fruits and vegetables, whole grains, lean protein, and low-fat dairy.  Take vitamin and mineral supplements as recommended by your health care provider.  Do not drink alcohol if: ? Your health care provider tells you not to drink. ? You are pregnant, may be pregnant, or are planning to become pregnant.  If you drink alcohol: ? Limit how much you have to 0-1 drink a day. ? Be aware of how much alcohol is in your drink. In the [place] one drink equals one 12 oz bottle of beer (355 mL), one 5 oz glass of wine (148 mL), or one 1 oz glass of hard liquor (44 mL). Lifestyle  Take daily care of your teeth and gums.  Stay active. Exercise for at least 30 minutes on 5 or more days each week.  Do not use any products that contain nicotine or tobacco, such as cigarettes, e-cigarettes, and chewing tobacco. If you need help quitting, ask your health care provider.  If you are sexually active, practice safe sex. Use a condom or other form of birth control (contraception) in order to prevent pregnancy and STIs (sexually transmitted infections). If you plan to become pregnant, see your health care provider for a preconception visit. What's next?  Visit your health care provider once a year for a well check visit.  Ask your health care provider how often you should have your eyes and teeth checked.  Stay up to date on all vaccines. This information is not intended to replace advice given to you by your health care provider. Make sure you discuss any questions you have with your health care provider. Document Revised: 10/07/2017 Document Reviewed: 10/07/2017 Elsevier Patient Education  2020 [employer].

## 2019-11-10 ENCOUNTER — Other Ambulatory Visit: Payer: Self-pay

## 2019-11-10 ENCOUNTER — Other Ambulatory Visit: Payer: BC Managed Care – PPO

## 2019-11-10 DIAGNOSIS — Z0000 Encounter for general adult medical examination without abnormal findings: Secondary | ICD-10-CM | POA: Diagnosis not present

## 2019-11-10 DIAGNOSIS — R5383 Other fatigue: Secondary | ICD-10-CM

## 2019-11-12 ENCOUNTER — Encounter: Payer: Self-pay | Admitting: Medical

## 2019-11-14 LAB — VITAMIN B1: Vitamin B1 (Thiamine): 15 nmol/L (ref 8–30)

## 2019-11-16 LAB — CBC WITH DIFFERENTIAL/PLATELET
Absolute Monocytes: 322 cells/uL (ref 200–950)
Basophils Absolute: 42 cells/uL (ref 0–200)
Basophils Relative: 0.8 %
Eosinophils Absolute: 62 cells/uL (ref 15–500)
Eosinophils Relative: 1.2 %
HCT: 43.3 % (ref 35.0–45.0)
Hemoglobin: 14.8 g/dL (ref 11.7–15.5)
Lymphs Abs: 1929 cells/uL (ref 850–3900)
MCH: 30.5 pg (ref 27.0–33.0)
MCHC: 34.2 g/dL (ref 32.0–36.0)
MCV: 89.3 fL (ref 80.0–100.0)
MPV: 10.3 fL (ref 7.5–12.5)
Monocytes Relative: 6.2 %
Neutro Abs: 2844 cells/uL (ref 1500–7800)
Neutrophils Relative %: 54.7 %
Platelets: 290 10*3/uL (ref 140–400)
RBC: 4.85 10*6/uL (ref 3.80–5.10)
RDW: 12.6 % (ref 11.0–15.0)
Total Lymphocyte: 37.1 %
WBC: 5.2 10*3/uL (ref 3.8–10.8)

## 2019-11-16 LAB — COMPREHENSIVE METABOLIC PANEL
AG Ratio: 2 (calc) (ref 1.0–2.5)
ALT: 13 U/L (ref 6–29)
AST: 13 U/L (ref 10–30)
Albumin: 4.5 g/dL (ref 3.6–5.1)
Alkaline phosphatase (APISO): 65 U/L (ref 31–125)
BUN: 11 mg/dL (ref 7–25)
CO2: 28 mmol/L (ref 20–32)
Calcium: 9.6 mg/dL (ref 8.6–10.2)
Chloride: 104 mmol/L (ref 98–110)
Creat: 0.78 mg/dL (ref 0.50–1.10)
Globulin: 2.3 g/dL (calc) (ref 1.9–3.7)
Glucose, Bld: 83 mg/dL (ref 65–99)
Potassium: 4.9 mmol/L (ref 3.5–5.3)
Sodium: 139 mmol/L (ref 135–146)
Total Bilirubin: 0.7 mg/dL (ref 0.2–1.2)
Total Protein: 6.8 g/dL (ref 6.1–8.1)

## 2019-11-16 LAB — T4, FREE: Free T4: 1.1 ng/dL (ref 0.8–1.8)

## 2019-11-16 LAB — LIPID PANEL
Cholesterol: 200 mg/dL — ABNORMAL HIGH (ref <200)
HDL: 97 mg/dL (ref >=50)
LDL Cholesterol (Calc): 89 mg/dL (calc)
Non-HDL Cholesterol (Calc): 103 mg/dL (calc) (ref <130)
Total CHOL/HDL Ratio: 2.1 (calc) (ref <5.0)
Triglycerides: 62 mg/dL (ref <150)

## 2019-11-16 LAB — VITAMIN D 1,25 DIHYDROXY
Vitamin D 1, 25 (OH)2 Total: 66 pg/mL (ref 18–72)
Vitamin D2 1, 25 (OH)2: <8 pg/mL
Vitamin D3 1, 25 (OH)2: 66 pg/mL

## 2019-11-16 LAB — TSH: TSH: 1.5 mIU/L

## 2019-11-16 LAB — VITAMIN B12: Vitamin B-12: 573 pg/mL (ref 200–1100)

## 2020-03-28 ENCOUNTER — Encounter: Payer: Self-pay | Admitting: Obstetrics and Gynecology

## 2020-03-28 ENCOUNTER — Ambulatory Visit: Payer: BC Managed Care – PPO | Admitting: Obstetrics and Gynecology

## 2020-03-28 ENCOUNTER — Other Ambulatory Visit: Payer: Self-pay

## 2020-03-28 ENCOUNTER — Telehealth: Payer: Self-pay | Admitting: Obstetrics and Gynecology

## 2020-03-28 VITALS — BP 122/68 | HR 72 | Resp 14 | Ht 64.17 in | Wt 134.0 lb

## 2020-03-28 DIAGNOSIS — N644 Mastodynia: Secondary | ICD-10-CM

## 2020-03-28 NOTE — Patient Instructions (Signed)

## 2020-03-28 NOTE — Progress Notes (Signed)
GYNECOLOGY  VISIT   HPI: 30 y.o.   Married  Hispanic  female   G1P1001 with Patient's last menstrual period was 03/21/2020.   here for  Breast check   Has a left breast nodule, always.  She has a perception of having a left breast for the last week.   She feels left breast with she takes a deep breath.  No shortness of breath.  No cough.  No sinusitis symptoms.  No fever.   No know Covid exposures.   Stopped breast feeding in July, 2021.  She has changed her bra.  Carries her child on her right side.   No breast trauma or new exercise plan.    Had [place] of left breast in May, 2020, and this showed dense fibroglandular tissue at 10:00.  GYNECOLOGIC HISTORY: Patient's last menstrual period was 03/21/2020. Contraception:  Paragard IUD  Menopausal hormone therapy:  none Last mammogram:  none Last pap smear:   07-25-19 negative, HR HPV negative         OB History    Gravida  1   Para  1   Term  1   Preterm  0   AB  0   Living  1     SAB  0   IAB  0   Ectopic  0   Multiple  0   Live Births  1              Patient Active Problem List   Diagnosis Date Noted  . SVD (spontaneous vaginal delivery) 05/09/2019  . Postpartum care following vaginal delivery 3/30 05/09/2019  . Obstetrical laceration - R ML episiotomy 05/09/2019  . Encounter for planned induction of labor 05/08/2019  . Left breast mass 05/16/2018    History reviewed. No pertinent past medical history.  History reviewed. No pertinent surgical history.  Current Outpatient Medications  Medication Sig Dispense Refill  . PARAGARD INTRAUTERINE COPPER IU by Intrauterine route.     No current facility-administered medications for this visit.     ALLERGIES: Patient has no known allergies.  Family History  Problem Relation Age of Onset  . Thyroid disease Mother   . Hypertension Father   . Breast cancer Paternal Grandmother        unsure age of onset    Social History   Socioeconomic  History  . Marital status: Married    Spouse name: Not on file  . Number of children: Not on file  . Years of education: Not on file  . Highest education level: Not on file  Occupational History  . Not on file  Tobacco Use  . Smoking status: Never Smoker  . Smokeless tobacco: Never Used  Vaping Use  . Vaping Use: Never used  Substance and Sexual Activity  . Alcohol use: Not Currently  . Drug use: Never  . Sexual activity: Yes    Birth control/protection: I.U.D.    Comment: paragard IUD 06/2019  Other Topics Concern  . Not on file  Social History Narrative  . Not on file   Social Determinants of Health   Financial Resource Strain: Not on file  Food Insecurity: Not on file  Transportation Needs: Not on file  Physical Activity: Not on file  Stress: Not on file  Social Connections: Not on file  Intimate Partner Violence: Not on file    Review of Systems  Genitourinary:       Left breast- possible mass  All other systems reviewed and are  negative.   PHYSICAL EXAMINATION:    BP 122/68 (BP Location: Right Arm, Patient Position: Sitting, Cuff Size: Normal)   Pulse 72   Resp 14   Ht 5' 4.17" (1.63 m)   Wt 134 lb (60.8 kg)   LMP 03/21/2020   BMI 22.88 kg/m     General appearance: alert, cooperative and appears stated age Lungs: clear to auscultation bilaterally Breasts: normal appearance, no masses or tenderness, No nipple retraction or dimpling, No nipple discharge or bleeding, No axillary or supraclavicular adenopathy Heart: regular rate and rhythm   Chaperone was present for exam.  ASSESSMENT  Left breast pain.  Hx dense breast tissue.  PLAN  We discussed caffeine as a potential contributor to breast pain.  Will proceed with dx bilateral mammogram and left breast US.  Advil 600 mg po q 6 hours prn.  Heat or ice prn.  Fu prn.   21 min  total time was spent for this patient encounter, including preparation, face-to-face counseling with the patient,  coordination of care, and documentation of the encounter.

## 2020-03-28 NOTE — Telephone Encounter (Signed)
Please schedule a [facility] bilateral [facility]   Patient has left breast pain.   She has dense breast tissue at 10:00 and had a prior ultrasound.   I feel the density today, but no specific mass is present.

## 2020-03-30 ENCOUNTER — Encounter: Payer: Self-pay | Admitting: Obstetrics and Gynecology

## 2020-04-01 ENCOUNTER — Other Ambulatory Visit: Payer: Self-pay | Admitting: Obstetrics and Gynecology

## 2020-04-01 ENCOUNTER — Other Ambulatory Visit: Payer: Self-pay

## 2020-04-01 DIAGNOSIS — R922 Inconclusive mammogram: Secondary | ICD-10-CM

## 2020-04-01 DIAGNOSIS — N644 Mastodynia: Secondary | ICD-10-CM

## 2020-04-01 NOTE — Telephone Encounter (Signed)
Orders placed. I called and spoke with Victorino Dike and scheduled appointment for 04/03/20 at 3:00pm at [facility]. I called patient and spoke with her husband and he said that date did not work for him to be able to stay home and keep baby.  I gave him the phone number so he could call and reschedule for a time that works for his family.

## 2020-04-03 ENCOUNTER — Other Ambulatory Visit: Payer: BC Managed Care – PPO

## 2020-04-16 DIAGNOSIS — Z03818 Encounter for observation for suspected exposure to other biological agents ruled out: Secondary | ICD-10-CM | POA: Diagnosis not present

## 2020-04-16 DIAGNOSIS — Z20822 Contact with and (suspected) exposure to covid-19: Secondary | ICD-10-CM | POA: Diagnosis not present

## 2020-04-18 ENCOUNTER — Ambulatory Visit
Admission: RE | Admit: 2020-04-18 | Discharge: 2020-04-18 | Disposition: A | Discharge status: Home / Self Care | Payer: BC Managed Care – PPO | Source: Ambulatory Visit | Attending: Obstetrics and Gynecology | Admitting: Obstetrics and Gynecology

## 2020-04-18 ENCOUNTER — Other Ambulatory Visit: Payer: Self-pay

## 2020-04-18 DIAGNOSIS — N644 Mastodynia: Secondary | ICD-10-CM

## 2020-04-18 DIAGNOSIS — R922 Inconclusive mammogram: Secondary | ICD-10-CM

## 2020-04-18 DIAGNOSIS — N6489 Other specified disorders of breast: Secondary | ICD-10-CM | POA: Diagnosis not present

## 2020-05-06 ENCOUNTER — Encounter: Payer: Self-pay | Admitting: Obstetrics and Gynecology

## 2020-08-09 ENCOUNTER — Ambulatory Visit: Payer: BC Managed Care – PPO | Admitting: Family

## 2020-08-09 ENCOUNTER — Other Ambulatory Visit: Payer: Self-pay

## 2020-08-09 VITALS — BP 100/60 | HR 71 | Temp 98.6°F | Ht 64.17 in | Wt 127.0 lb

## 2020-08-09 DIAGNOSIS — L659 Nonscarring hair loss, unspecified: Secondary | ICD-10-CM

## 2020-08-09 DIAGNOSIS — M549 Dorsalgia, unspecified: Secondary | ICD-10-CM | POA: Diagnosis not present

## 2020-08-09 LAB — COMPREHENSIVE METABOLIC PANEL
ALT: 18 U/L (ref 0–35)
AST: 23 U/L (ref 0–37)
Albumin: 4.5 g/dL (ref 3.5–5.2)
Alkaline Phosphatase: 49 U/L (ref 39–117)
BUN: 15 mg/dL (ref 6–23)
CO2: 28 mEq/L (ref 19–32)
Calcium: 9.5 mg/dL (ref 8.4–10.5)
Chloride: 102 mEq/L (ref 96–112)
Creatinine, Ser: 0.88 mg/dL (ref 0.40–1.20)
GFR: 87.46 mL/min (ref >60.00)
Glucose, Bld: 72 mg/dL (ref 70–99)
Potassium: 4 mEq/L (ref 3.5–5.1)
Sodium: 139 mEq/L (ref 135–145)
Total Bilirubin: 0.5 mg/dL (ref 0.2–1.2)
Total Protein: 6.9 g/dL (ref 6.0–8.3)

## 2020-08-09 LAB — CBC WITH DIFFERENTIAL/PLATELET
Basophils Absolute: 0 10*3/uL (ref 0.0–0.1)
Basophils Relative: 0.5 % (ref 0.0–3.0)
Eosinophils Absolute: 0.1 10*3/uL (ref 0.0–0.7)
Eosinophils Relative: 0.7 % (ref 0.0–5.0)
HCT: 41.6 % (ref 36.0–46.0)
Hemoglobin: 14.1 g/dL (ref 12.0–15.0)
Lymphocytes Relative: 29.5 % (ref 12.0–46.0)
Lymphs Abs: 2.6 10*3/uL (ref 0.7–4.0)
MCHC: 33.9 g/dL (ref 30.0–36.0)
MCV: 90.2 fl (ref 78.0–100.0)
Monocytes Absolute: 0.5 10*3/uL (ref 0.1–1.0)
Monocytes Relative: 5.2 % (ref 3.0–12.0)
Neutro Abs: 5.6 10*3/uL (ref 1.4–7.7)
Neutrophils Relative %: 64.1 % (ref 43.0–77.0)
Platelets: 285 10*3/uL (ref 150.0–400.0)
RBC: 4.62 Mil/uL (ref 3.87–5.11)
RDW: 13 % (ref 11.5–15.5)
WBC: 8.7 10*3/uL (ref 4.0–10.5)

## 2020-08-09 LAB — TSH: TSH: 1.51 u[IU]/mL (ref 0.35–5.50)

## 2020-08-09 MED ORDER — MELOXICAM 15 MG PO TABS: 15.0000 mg | ORAL_TABLET | Freq: Every day | ORAL | 0 refills | Status: DC

## 2020-08-09 NOTE — Progress Notes (Signed)
  Dawn Johnson is a 30 y.o. female with the following history as recorded in EpicCare:  Patient Active Problem List   Diagnosis Date Noted   SVD (spontaneous vaginal delivery) 05/09/2019   Postpartum care following vaginal delivery 3/30 05/09/2019   Obstetrical laceration - R ML episiotomy 05/09/2019   Encounter for planned induction of labor 05/08/2019   Left breast mass 05/16/2018    Current Outpatient Medications  Medication Sig Dispense Refill   meloxicam (MOBIC) 15 MG tablet Take 1 tablet (15 mg total) by mouth daily. 30 tablet 0   PARAGARD INTRAUTERINE COPPER IU by Intrauterine route.     No current facility-administered medications for this visit.    Allergies: Patient has no known allergies.  No past medical history on file.  No past surgical history on file.  Family History  Problem Relation Age of Onset   Thyroid disease Mother    Hypertension Father    Breast cancer Paternal Grandmother        unsure age of onset    Social History   Tobacco Use   Smoking status: Never   Smokeless tobacco: Never  Substance Use Topics   Alcohol use: Not Currently    Subjective:  1) Requesting to have labs drawn to re-check her thyroid level today; concerned about hair loss;  2) Upper back pain x 2 weeks; no known injury or trauma; does work as Art therapist- works full time; has not taken any medication; husband did massage and used topical OTC medication which helped some;  LMP 2 weeks ago/ IUD/ not breast-feeding    Objective:  Vitals:   08/09/20 1313  BP: 100/60  Pulse: 71  Temp: 98.6 F (37 C)  TempSrc: Oral  SpO2: 97%  Weight: 127 lb (57.6 kg)  Height: 5' 4.17" (1.63 m)    General: Well developed, well nourished, in no acute distress  Skin : Warm and dry.  Head: Normocephalic and atraumatic  Eyes: Sclera and conjunctiva clear; pupils round and reactive to light; extraocular movements intact  Ears: External normal; canals clear; tympanic membranes normal   Oropharynx: Pink, supple. No suspicious lesions  Neck: Supple without thyromegaly, adenopathy  Lungs: Respirations unlabored;  Musculoskeletal: No deformities; no active joint inflammation  Extremities: No edema, cyanosis, clubbing  Vessels: Symmetric bilaterally  Neurologic: Alert and oriented; speech intact; face symmetrical; moves all extremities well; CNII-XII intact without focal deficit   Assessment:  1. Alopecia   2. Upper back pain     Plan:  Suspect related to hormone changes from pregnancy/ breast-feeding in the past year; check CBC, CMP, TSH, ANA today;  Suspect muscular; trial of Mobic 15 mg qd prn until pain resolves; follow up worse, no better.  This visit occurred during the SARS-CoV-2 public health emergency.  Safety protocols were in place, including screening questions prior to the visit, additional usage of staff PPE, and extensive cleaning of exam room while observing appropriate contact time as indicated for disinfecting solutions.    No follow-ups on file.  Orders Placed This Encounter  Procedures   CBC with Differential/Platelet   Comp Met (CMET)   TSH   Antinuclear Antib (ANA)    Requested Prescriptions   Signed Prescriptions Disp Refills   meloxicam (MOBIC) 15 MG tablet 30 tablet 0    Sig: Take 1 tablet (15 mg total) by mouth daily.

## 2020-08-13 ENCOUNTER — Encounter: Payer: Self-pay | Admitting: Family

## 2020-08-13 LAB — ANTI-NUCLEAR AB-TITER (ANA TITER): ANA Titer 1: 1:40 {titer} — ABNORMAL HIGH

## 2020-08-13 LAB — ANA: Anti Nuclear Antibody (ANA): POSITIVE — AB

## 2020-11-08 ENCOUNTER — Ambulatory Visit: Payer: BC Managed Care – PPO | Admitting: Obstetrics and Gynecology

## 2020-11-08 NOTE — Progress Notes (Deleted)
30 y.o. G83P1001 Married [place] female here for annual exam.    PCP:     No LMP recorded. (Menstrual status: IUD).           Sexually active: {yes no:314532}  The current method of family planning is ***IUD--Paragard 06/2019.    Exercising: {yes no:314532}  {types:19826} Smoker:  no  Health Maintenance: Pap: 07-25-19 Neg:Neg HR HPV, 2017 normal per patient History of abnormal Pap:  {YES NO:22349} MMG: 04-19-19 Diag Bil w/Lt.US//Neg/BiRads1/screening age 5 Colonoscopy:  n/a BMD:   n/a  Result  n/a TDaP:  2020 Gardasil:   yes HIV: Neg in preg Hep C: Unsure Screening Labs:  Hb today: ***, Urine today: ***   reports that she has never smoked. She has never used smokeless tobacco. She reports that she does not currently use alcohol. She reports that she does not use drugs.  No past medical history on file.  No past surgical history on file.  Current Outpatient Medications  Medication Sig Dispense Refill   meloxicam (MOBIC) 15 MG tablet Take 1 tablet (15 mg total) by mouth daily. 30 tablet 0   PARAGARD INTRAUTERINE COPPER IU by Intrauterine route.     No current facility-administered medications for this visit.    Family History  Problem Relation Age of Onset   Thyroid disease Mother    Hypertension Father    Breast cancer Paternal Grandmother        unsure age of onset    Review of Systems  Exam:   There were no vitals taken for this visit.    General appearance: alert, cooperative and appears stated age Head: normocephalic, without obvious abnormality, atraumatic Neck: no adenopathy, supple, symmetrical, trachea midline and thyroid normal to inspection and palpation Lungs: clear to auscultation bilaterally Breasts: normal appearance, no masses or tenderness, No nipple retraction or dimpling, No nipple discharge or bleeding, No axillary adenopathy Heart: regular rate and rhythm Abdomen: soft, non-tender; no masses, no organomegaly Extremities: extremities normal,  atraumatic, no cyanosis or edema Skin: skin color, texture, turgor normal. No rashes or lesions Lymph nodes: cervical, supraclavicular, and axillary nodes normal. Neurologic: grossly normal  Pelvic: External genitalia:  no lesions              No abnormal inguinal nodes palpated.              Urethra:  normal appearing urethra with no masses, tenderness or lesions              Bartholins and Skenes: normal                 Vagina: normal appearing vagina with normal color and discharge, no lesions              Cervix: no lesions              Pap taken: {yes no:314532} Bimanual Exam:  Uterus:  normal size, contour, position, consistency, mobility, non-tender              Adnexa: no mass, fullness, tenderness              Rectal exam: {yes no:314532}.  Confirms.              Anus:  normal sphincter tone, no lesions  Chaperone was present for exam:  ***  Assessment:   Well woman visit with gynecologic exam.   Plan: Mammogram screening discussed. Self breast awareness reviewed. Pap and HR HPV as above. Guidelines for Calcium, Vitamin  D, regular exercise program including cardiovascular and weight bearing exercise.   Follow up annually and prn.   Additional counseling given.  {yes [id number]. _______ minutes face to face time of which over 50% was spent in counseling.    After visit summary provided.

## 2020-11-21 NOTE — Progress Notes (Signed)
30 y.o. G36P1001 Married [place] female here for annual exam.    Cycles ok with ParaGard.   Wants routine exams.  PCP:  Esperanza Richters, PA-C  Patient's last menstrual period was 11/18/2020 (approximate).     Period Cycle (Days): 30 Period Duration (Days): 5 Period Pattern: Regular Menstrual Flow: Moderate Menstrual Control: Maxi pad Menstrual Control Change Freq (Hours): changes maxi pad every 3 hours on heaviest day Dysmenorrhea: None     Sexually active: Yes.    The current method of family planning is IUD--Paragard 06/2019.    Exercising: Yes.     Works out at gym 2 days/week Smoker:  no  Health Maintenance: Pap:  2021 Neg:Neg HR HPV, 2017 normal per patient History of abnormal Pap:  no MMG: 04-18-20 Diag.Bil.w/Lt.US//Neg/BiRads1 Colonoscopy:  n/a BMD:   n/a  Result  n/a TDaP:  2020 Gardasil:   yes HIV: Unsure Hep C: Unsure Screening Labs:  today. Flu vaccine:  she thinks she has already done.  Covid booster:  completed one.    reports that she has never smoked. She has never used smokeless tobacco. She reports current alcohol use of about 3.0 standard drinks per week. She reports that she does not use drugs.  History reviewed. No pertinent past medical history.  History reviewed. No pertinent surgical history.  Current Outpatient Medications  Medication Sig Dispense Refill   PARAGARD INTRAUTERINE COPPER IU by Intrauterine route.     No current facility-administered medications for this visit.    Family History  Problem Relation Age of Onset   Thyroid disease Mother    Hypertension Father    Breast cancer Paternal Grandmother        unsure age of onset    Review of Systems  All other systems reviewed and are negative.  Exam:   BP 108/70   Pulse 67   Ht 5' 3.5" (1.613 m)   Wt 131 lb (59.4 kg)   LMP 11/18/2020 (Approximate)   SpO2 100%   BMI 22.84 kg/m     General appearance: alert, cooperative and appears stated age Head: normocephalic, without  obvious abnormality, atraumatic Neck: no adenopathy, supple, symmetrical, trachea midline and thyroid normal to inspection and palpation Lungs: clear to auscultation bilaterally Breasts: normal appearance, no masses or tenderness, No nipple retraction or dimpling, No nipple discharge or bleeding, No axillary adenopathy Heart: regular rate and rhythm Abdomen: soft, non-tender; no masses, no organomegaly Extremities: extremities normal, atraumatic, no cyanosis or edema Skin: skin color, texture, turgor normal. No rashes or lesions Lymph nodes: cervical, supraclavicular, and axillary nodes normal. Neurologic: grossly normal  Pelvic: External genitalia:  no lesions              No abnormal inguinal nodes palpated.              Urethra:  normal appearing urethra with no masses, tenderness or lesions              Bartholins and Skenes: normal                 Vagina: normal appearing vagina with normal color and discharge, no lesions              Cervix: no lesions.  IUD strings noted.               Pap taken: no Bimanual Exam:  Uterus:  normal size, contour, position, consistency, mobility, non-tender              Adnexa:  no mass, fullness, tenderness       Chaperone was present for exam:  Marchelle Folks, CMA  Assessment:   Well woman visit with gynecologic exam. ParaGard IUD.  Plan: Mammogram screening discussed. Self breast awareness reviewed. Pap and HR HPV as above. Guidelines for Calcium, Vitamin D, regular exercise program including cardiovascular and weight bearing exercise. Routine labs.  New Covid booster discussed.  Follow up annually and prn.    After visit summary provided.

## 2020-11-25 ENCOUNTER — Encounter: Payer: Self-pay | Admitting: Obstetrics and Gynecology

## 2020-11-25 ENCOUNTER — Other Ambulatory Visit: Payer: Self-pay

## 2020-11-25 ENCOUNTER — Ambulatory Visit (INDEPENDENT_AMBULATORY_CARE_PROVIDER_SITE_OTHER): Payer: BC Managed Care – PPO | Admitting: Obstetrics and Gynecology

## 2020-11-25 VITALS — BP 108/70 | HR 67 | Ht 63.5 in | Wt 131.0 lb

## 2020-11-25 DIAGNOSIS — Z01419 Encounter for gynecological examination (general) (routine) without abnormal findings: Secondary | ICD-10-CM

## 2020-11-25 LAB — COMPREHENSIVE METABOLIC PANEL
AG Ratio: 2.1 (calc) (ref 1.0–2.5)
ALT: 13 U/L (ref 6–29)
AST: 12 U/L (ref 10–30)
Albumin: 4.4 g/dL (ref 3.6–5.1)
Alkaline phosphatase (APISO): 46 U/L (ref 31–125)
BUN: 12 mg/dL (ref 7–25)
CO2: 31 mmol/L (ref 20–32)
Calcium: 9.1 mg/dL (ref 8.6–10.2)
Chloride: 103 mmol/L (ref 98–110)
Creat: 0.71 mg/dL (ref 0.50–0.97)
Globulin: 2.1 g/dL (calc) (ref 1.9–3.7)
Glucose, Bld: 83 mg/dL (ref 65–99)
Potassium: 4 mmol/L (ref 3.5–5.3)
Sodium: 138 mmol/L (ref 135–146)
Total Bilirubin: 0.5 mg/dL (ref 0.2–1.2)
Total Protein: 6.5 g/dL (ref 6.1–8.1)

## 2020-11-25 LAB — CBC
HCT: 40.7 % (ref 35.0–45.0)
Hemoglobin: 13.4 g/dL (ref 11.7–15.5)
MCH: 29.9 pg (ref 27.0–33.0)
MCHC: 32.9 g/dL (ref 32.0–36.0)
MCV: 90.8 fL (ref 80.0–100.0)
MPV: 10.4 fL (ref 7.5–12.5)
Platelets: 270 10*3/uL (ref 140–400)
RBC: 4.48 10*6/uL (ref 3.80–5.10)
RDW: 12.1 % (ref 11.0–15.0)
WBC: 6.4 10*3/uL (ref 3.8–10.8)

## 2020-11-25 LAB — LIPID PANEL
Cholesterol: 174 mg/dL (ref <200)
HDL: 94 mg/dL (ref >=50)
LDL Cholesterol (Calc): 66 mg/dL (calc)
Non-HDL Cholesterol (Calc): 80 mg/dL (calc) (ref <130)
Total CHOL/HDL Ratio: 1.9 (calc) (ref <5.0)
Triglycerides: 65 mg/dL (ref <150)

## 2020-11-25 NOTE — Patient Instructions (Signed)

## 2021-01-08 DIAGNOSIS — M79642 Pain in left hand: Secondary | ICD-10-CM | POA: Diagnosis not present

## 2021-02-17 ENCOUNTER — Ambulatory Visit: Payer: BC Managed Care – PPO | Admitting: Internal Medicine

## 2021-02-17 ENCOUNTER — Encounter: Payer: Self-pay | Admitting: Internal Medicine

## 2021-02-17 ENCOUNTER — Telehealth: Payer: Self-pay

## 2021-02-17 VITALS — BP 106/64 | HR 94 | Temp 98.4°F | Resp 16 | Ht 63.5 in | Wt 126.5 lb

## 2021-02-17 DIAGNOSIS — R197 Diarrhea, unspecified: Secondary | ICD-10-CM

## 2021-02-17 DIAGNOSIS — B349 Viral infection, unspecified: Secondary | ICD-10-CM

## 2021-02-17 LAB — POC COVID19 BINAXNOW: SARS Coronavirus 2 Ag: NEGATIVE

## 2021-02-17 MED ORDER — ONDANSETRON HCL 8 MG PO TABS: 8.0000 mg | ORAL_TABLET | Freq: Three times a day (TID) | ORAL | 0 refills | Status: DC | PRN

## 2021-02-17 NOTE — Progress Notes (Signed)
° °  Subjective:    Patient ID: Dawn Johnson, female    DOB: 07-25-1990, 31 y.o.   MRN: [id number]  DOS:  02/17/2021 Type of visit - description: acute, here w/ her husband  Symptoms a started 2 days ago while she was visiting [place]. Headache, stomach pain, nausea vomiting, multiple episodes of watery diarrhea without any blood. She is trying to drink plenty of fluids however she is vomiting frequently. While in [place], she did not visit  any rural areas and did not eat any unusual foods  They have a 91 year 97-month-old baby, child  is sick, had diarrhea.  Pediatrician said that she might have a virus.  Denies any fever chills No dysuria or gross hematuria No dizziness when she stands up.   Review of Systems See above   No past medical history on file.  No past surgical history on file.  Current Outpatient Medications  Medication Instructions   PARAGARD INTRAUTERINE COPPER IU Intrauterine       Objective:   Physical Exam BP 106/64 (BP Location: Left Arm, Patient Position: Sitting, Cuff Size: Small)    Pulse 94    Temp 98.4 F (36.9 C) (Oral)    Resp 16    Ht 5' 3.5" (1.613 m)    Wt 126 lb 8 oz (57.4 kg)    SpO2 98%    Breastfeeding No    BMI 22.06 kg/m  General:   Well developed, NAD, BMI noted.  HEENT:  Normocephalic . Face symmetric, atraumatic.  Not pale, not jaundiced Lungs:  CTA B Normal respiratory effort, no intercostal retractions, no accessory muscle use. Heart: RRR,  no murmur.  Abdomen:  Not distended, soft, minimal tenderness at the upper abdomen on the left abdomen without mass or rebound. Skin: Not pale. Not jaundice Lower extremities: no pretibial edema bilaterally  Neurologic:  alert & oriented X3.  Speech normal, gait appropriate for age and unassisted Psych--  Cognition and judgment appear intact.  Cooperative with normal attention span and concentration.  Behavior appropriate. No anxious or depressed appearing.     Assessment      31 year old female, PMH negative, has IUD, presents with: Acute diarrhea: As described above in the context of recent travel to [place].  Also her daughter is sick with a virus and diarrhea. She is slightly tachycardic when she stands up but her BP did not drop. COVID test negative DDx includes traveler's diarrhea vs viral diarrhea (norovirus) vs others  Recommend Zofran, OTC Imodium, push fluids, call if not gradually better.  See AVS    This visit occurred during the SARS-CoV-2 public health emergency.  Safety protocols were in place, including screening questions prior to the visit, additional usage of staff PPE, and extensive cleaning of exam room while observing appropriate contact time as indicated for disinfecting solutions.

## 2021-02-17 NOTE — Telephone Encounter (Signed)
Nurse Assessment Nurse: Mat Carne, RN, Judeth Cornfield Date/Time Dawn Johnson Time): 02/17/2021 7:43:48 AM Confirm and document reason for call. If symptomatic, describe symptoms. ---Caller states that his wife is not feeling well and needing to schedule a sick appt Caller states his wife has diarrhea along with head and vomiting and stomach pains after returning from [place] on Sunday, caller asking to allow 30 mins to give him time to get home with his wife-caller plans to call office back in about 20 min or so since he will be home with his wife then and would like to schedule an appointment Does the patient have any new or worsening symptoms? ---Yes Will a triage be completed? ---No Select reason for no triage. ---Other Disp. Time Dawn Johnson Time) Disposition Final User 02/17/2021 7:49:12 AM Clinical Call Yes Mat Carne, RN, Judeth Cornfield

## 2021-02-17 NOTE — Patient Instructions (Signed)
Rest  Take Zofran as needed for nausea  Try to push fluids including soup, Gatorade, ginger ale.  Call if not gradually better  Call anytime if high fever, severe symptoms, dizziness when you stand up.  Okay to take over-the-counter Imodium.    Viral Gastroenteritis, Adult Viral gastroenteritis is also known as the stomach flu. This condition may affect your stomach, your small intestine, and your large intestine. It can cause sudden watery poop (diarrhea), fever, and throwing up (vomiting). This condition is caused by certain germs (viruses). These germs can be passed from person to person very easily (are contagious). Having watery poop and throwing up can make you feel weak and cause you to not have enough water in your body (get dehydrated). This can make you tired and thirsty, make you have a dry mouth, and make it so you pee (urinate) less often. It is important to replace the fluids that you lose from having watery poop and throwing up. What are the causes? You can get sick by catching viruses from other people. You can also get sick by: Eating food, drinking water, or touching a surface that has the viruses on it (is contaminated). Sharing utensils or other personal items with a person who is sick. What increases the risk? Having a weak body defense system (immune system). Living with one or more children who are younger than 36 years old. Living in a nursing home. Going on cruise ships. What are the signs or symptoms? Symptoms of this condition start suddenly. Symptoms may last for a few days or for as long as a week. Common symptoms include: Watery poop. Throwing up. Other symptoms include: Fever. Headache. Feeling tired (fatigue). Pain in the belly (abdomen). Chills. Feeling weak. Feeling sick to your stomach (nauseous). Muscle aches. Not feeling hungry. How is this treated? This condition typically goes away on its own. The focus of treatment is to replace the  fluids that you lose. This condition may be treated with: An ORS (oral rehydration solution). This is a drink that is sold at pharmacies and stores. Medicines to help with your symptoms. Probiotic supplements to reduce symptoms of diarrhea. Fluids given through an IV tube, if needed. Older adults and people with other diseases or a weak body defense system are at higher risk for not having enough water in the body. Follow these instructions at home: Eating and drinking  Take an ORS as told by your doctor. Drink clear fluids in small amounts as you are able. Clear fluids include: Water. Ice chips. Fruit juice with water added to it (diluted). Low-calorie sports drinks. Drink enough fluid to keep your pee (urine) pale yellow. Eat small amounts of healthy foods every 3-4 hours as you are able. This may include whole grains, fruits, vegetables, lean meats, and yogurt. Avoid fluids that have a lot of sugar or caffeine in them, such as energy drinks, sports drinks, and soda. Avoid spicy or fatty foods. Avoid alcohol. General instructions  Wash your hands often. This is very important after you have watery poop or you throw up. If you cannot use soap and water, use hand sanitizer. Make sure that all people in your home wash their hands well and often. Take over-the-counter and prescription medicines only as told by your doctor. Rest at home while you get better. Watch your condition for any changes. Take a warm bath to help with any burning or pain from having watery poop. Keep all follow-up visits as told by your doctor. This  is important. Contact a doctor if: You cannot keep fluids down. Your symptoms get worse. You have new symptoms. You feel light-headed. You feel dizzy. You have muscle cramps. Get help right away if: You have chest pain. You feel very weak. You pass out (faint). You see blood in your throw-up. Your throw-up looks like coffee grounds. You have bloody or black  poop (stools) or poop that looks like tar. You have a very bad headache, or a stiff neck, or both. You have a rash. You have very bad pain, cramping, or bloating in your belly. You have trouble breathing. You are breathing very quickly. You have a fast heartbeat. Your skin feels cold and clammy. You feel mixed up (confused). You have pain when you pee. You have signs of not having enough water in the body, such as: Dark pee, hardly any pee, or no pee. Cracked lips. Dry mouth. Sunken eyes. Feeling very sleepy. Feeling weak. Summary Viral gastroenteritis is also known as the stomach flu. This condition can cause sudden watery poop (diarrhea), fever, and throwing up (vomiting). These germs can be passed from person to person very easily. Take an ORS as told by your doctor. This is a drink that is sold at pharmacies and stores. Drink fluids in small amounts many times each day as you are able. This information is not intended to replace advice given to you by your health care provider. Make sure you discuss any questions you have with your health care provider. Document Revised: 12/01/2017 Document Reviewed: 12/01/2017 Elsevier Patient Education  2022 [employer].

## 2021-02-17 NOTE — Telephone Encounter (Signed)
Pt has an appt today 02/17/21

## 2021-03-11 DIAGNOSIS — M545 Low back pain, unspecified: Secondary | ICD-10-CM | POA: Diagnosis not present

## 2021-03-25 ENCOUNTER — Encounter: Payer: Self-pay | Admitting: Obstetrics and Gynecology

## 2021-03-25 ENCOUNTER — Ambulatory Visit: Payer: BC Managed Care – PPO | Admitting: Obstetrics and Gynecology

## 2021-03-25 ENCOUNTER — Other Ambulatory Visit: Payer: Self-pay

## 2021-03-25 VITALS — BP 100/60 | Ht 64.5 in | Wt 131.0 lb

## 2021-03-25 DIAGNOSIS — Z5181 Encounter for therapeutic drug level monitoring: Secondary | ICD-10-CM | POA: Diagnosis not present

## 2021-03-25 DIAGNOSIS — N898 Other specified noninflammatory disorders of vagina: Secondary | ICD-10-CM | POA: Diagnosis not present

## 2021-03-25 LAB — WET PREP FOR TRICH, YEAST, CLUE

## 2021-03-25 NOTE — Progress Notes (Signed)
GYNECOLOGY  VISIT   HPI: 31 y.o.   Married  [place]  female   G1P1001 with No LMP recorded. (Menstrual status: IUD).   here for increased vaginal discharge. She thinks may be related to a new medication she is on from [place] for treatment of acne.  Taking isotretinoin, which causes her skin to be dry.  She is asking for a series of labs to be done, requested by her doctor in [place] due to her use of the isotretinoin.  Increased vaginal discharge for 2 - 3 months, which is clear. No itching.  No odor.   No use of intravaginal treatments.   GYNECOLOGIC HISTORY: No LMP recorded. (Menstrual status: IUD). Contraception:  Paragard IUD 06/2019 Menopausal hormone therapy:  n/a Last mammogram:  04-18-20 Diag.Bil.w/Lt.US//Neg/BiRads1 Last pap smear:   2021 Neg:Neg HR HPV, 2017 normal per patient        OB History     Gravida  1   Para  1   Term  1   Preterm  0   AB  0   Living  1      SAB  0   IAB  0   Ectopic  0   Multiple  0   Live Births  1              Patient Active Problem List   Diagnosis Date Noted   SVD (spontaneous vaginal delivery) 05/09/2019   Postpartum care following vaginal delivery 3/30 05/09/2019   Obstetrical laceration - R ML episiotomy 05/09/2019   Encounter for planned induction of labor 05/08/2019   Left breast mass 05/16/2018    History reviewed. No pertinent past medical history.  History reviewed. No pertinent surgical history.  Current Outpatient Medications  Medication Sig Dispense Refill   lidocaine (LIDODERM) 5 % Lidoderm 5 % topical patch  APPLY 1 PATCH BY TOPICAL ROUTE ONCE DAILY (MAY WEAR UP TO 12HOURS.)     PARAGARD INTRAUTERINE COPPER IU by Intrauterine route.     No current facility-administered medications for this visit.     ALLERGIES: Patient has no known allergies.  Family History  Problem Relation Age of Onset   Thyroid disease Mother    Hypertension Father    Breast cancer Paternal Grandmother         unsure age of onset    Social History   Socioeconomic History   Marital status: Married    Spouse name: Not on file   Number of children: Not on file   Years of education: Not on file   Highest education level: Not on file  Occupational History   Not on file  Tobacco Use   Smoking status: Never   Smokeless tobacco: Never  Vaping Use   Vaping Use: Never used  Substance and Sexual Activity   Alcohol use: Yes    Alcohol/week: 3.0 standard drinks    Types: 3 Cans of beer per week   Drug use: Never   Sexual activity: Yes    Birth control/protection: I.U.D.    Comment: paragard IUD 06/2019  Other Topics Concern   Not on file  Social History Narrative   Not on file   Social Determinants of Health   Financial Resource Strain: Not on file  Food Insecurity: Not on file  Transportation Needs: Not on file  Physical Activity: Not on file  Stress: Not on file  Social Connections: Not on file  Intimate Partner Violence: Not on file    Review of Systems  Genitourinary:  Positive for vaginal discharge.  All other systems reviewed and are negative.  PHYSICAL EXAMINATION:    BP 100/60    Ht 5' 4.5" (1.638 m)    Wt 131 lb (59.4 kg)    BMI 22.14 kg/m     General appearance: alert, cooperative and appears stated age   Pelvic: External genitalia:  no lesions              Urethra:  normal appearing urethra with no masses, tenderness or lesions              Bartholins and Skenes: normal                 Vagina: normal appearing vagina with normal color and discharge, no lesions              Cervix: no lesions.  IUD strings noted.                 Bimanual Exam:  Uterus:  normal size, contour, position, consistency, mobility, non-tender              Adnexa: no mass, fullness, tenderness             Chaperone was present for exam:  Marchelle Folks, CMA  ASSESSMENT  Vaginal discharge.  Paragard IUD.  Adult acne.  Accutane usage. Medication monitoring encounter.   PLAN  Wet prep  negative.  Reassurance given to patient regarding vaginal discharge. Labs ordered for monitoring of her isotretinoin.  FU prn.    An After Visit Summary was printed and given to the patient.

## 2021-03-26 LAB — COMPREHENSIVE METABOLIC PANEL
AG Ratio: 2.2 (calc) (ref 1.0–2.5)
ALT: 16 U/L (ref 6–29)
AST: 19 U/L (ref 10–30)
Albumin: 4.9 g/dL (ref 3.6–5.1)
Alkaline phosphatase (APISO): 44 U/L (ref 31–125)
BUN: 14 mg/dL (ref 7–25)
CO2: 26 mmol/L (ref 20–32)
Calcium: 9.4 mg/dL (ref 8.6–10.2)
Chloride: 104 mmol/L (ref 98–110)
Creat: 0.87 mg/dL (ref 0.50–0.97)
Globulin: 2.2 g/dL (calc) (ref 1.9–3.7)
Glucose, Bld: 80 mg/dL (ref 65–99)
Potassium: 3.9 mmol/L (ref 3.5–5.3)
Sodium: 138 mmol/L (ref 135–146)
Total Bilirubin: 0.5 mg/dL (ref 0.2–1.2)
Total Protein: 7.1 g/dL (ref 6.1–8.1)

## 2021-03-26 LAB — CBC
HCT: 41.7 % (ref 35.0–45.0)
Hemoglobin: 13.9 g/dL (ref 11.7–15.5)
MCH: 29.4 pg (ref 27.0–33.0)
MCHC: 33.3 g/dL (ref 32.0–36.0)
MCV: 88.3 fL (ref 80.0–100.0)
MPV: 10.9 fL (ref 7.5–12.5)
Platelets: 276 10*3/uL (ref 140–400)
RBC: 4.72 10*6/uL (ref 3.80–5.10)
RDW: 12 % (ref 11.0–15.0)
WBC: 8 10*3/uL (ref 3.8–10.8)

## 2021-03-26 LAB — LIPID PANEL
Cholesterol: 193 mg/dL (ref <200)
HDL: 86 mg/dL (ref >=50)
LDL Cholesterol (Calc): 90 mg/dL (calc)
Non-HDL Cholesterol (Calc): 107 mg/dL (calc) (ref <130)
Total CHOL/HDL Ratio: 2.2 (calc) (ref <5.0)
Triglycerides: 76 mg/dL (ref <150)

## 2021-03-26 LAB — CK: Total CK: 323 U/L — ABNORMAL HIGH (ref 29–143)

## 2021-03-26 LAB — HCG, SERUM, QUALITATIVE: Preg, Serum: NEGATIVE

## 2021-03-26 LAB — GAMMA GT: GGT: 8 U/L (ref 3–50)

## 2021-04-03 ENCOUNTER — Telehealth: Payer: Self-pay

## 2021-04-03 ENCOUNTER — Telehealth: Payer: Self-pay | Admitting: Obstetrics and Gynecology

## 2021-04-03 NOTE — Telephone Encounter (Signed)
I called patient because My Chart message sent by Dr. Quincy Simmonds returned unread.  I spoke with patient and let her know Dr. Quincy Simmonds had sent the message and it is in her My Chart to read but I also read it to her. She said he had already seen her results and had placed a call to her physician.   "Hi Lyndell,    Please contact your physician in [place] regarding your lab results.  Your CK level is significantly elevated, and this my be due to your isotretinoin medication.  I will need your physician in [place] or Dr. Harvie Heck to advise you about this.    The other tests were normal including cholesterol, blood chemistries, blood counts, and gamma GT.   Your blood pregnancy test is negative.   Please contact the office for any questions.    Have a good weekend!   Josefa Half, MD"

## 2021-04-07 NOTE — Telephone Encounter (Signed)
Patient has received her results of testing and is aware of the elevated CK level.  Please refer to phone note 04/03/21.

## 2021-04-08 DIAGNOSIS — B308 Other viral conjunctivitis: Secondary | ICD-10-CM | POA: Diagnosis not present

## 2021-09-26 ENCOUNTER — Ambulatory Visit: Payer: BC Managed Care – PPO | Admitting: Family

## 2021-11-25 NOTE — Progress Notes (Unsigned)
31 y.o. G71P1001 Married [place] female here for annual exam.    PCP:     No LMP recorded. (Menstrual status: IUD).           Sexually active: {yes no:314532}  The current method of family planning is IUD.   Papagard  Exercising: {yes no:314532}  {types:19826} Smoker:  {YES NO:22349}  Health Maintenance: Pap:   2021 Neg:Neg HR HPV, 2017 normal per patient History of abnormal Pap:  no MMG:  3/10/22density D Bi-rads 1 neg Lt.US//Neg/BiRads1 Colonoscopy:  n/a BMD:   n/a  Result   TDaP:  2020 Gardasil:   yes HIV: Hep C: Screening Labs:  Hb today: ***, Urine today: ***   reports that she has never smoked. She has never used smokeless tobacco. She reports current alcohol use of about 3.0 standard drinks of alcohol per week. She reports that she does not use drugs.  No past medical history on file.  No past surgical history on file.  Current Outpatient Medications  Medication Sig Dispense Refill   lidocaine (LIDODERM) 5 % Lidoderm 5 % topical patch  APPLY 1 PATCH BY TOPICAL ROUTE ONCE DAILY (MAY WEAR UP TO 12HOURS.)     PARAGARD INTRAUTERINE COPPER IU by Intrauterine route.     No current facility-administered medications for this visit.    Family History  Problem Relation Age of Onset   Thyroid disease Mother    Hypertension Father    Breast cancer Paternal Grandmother        unsure age of onset    Review of Systems  Exam:   There were no vitals taken for this visit.    General appearance: alert, cooperative and appears stated age Head: normocephalic, without obvious abnormality, atraumatic Neck: no adenopathy, supple, symmetrical, trachea midline and thyroid normal to inspection and palpation Lungs: clear to auscultation bilaterally Breasts: normal appearance, no masses or tenderness, No nipple retraction or dimpling, No nipple discharge or bleeding, No axillary adenopathy Heart: regular rate and rhythm Abdomen: soft, non-tender; no masses, no  organomegaly Extremities: extremities normal, atraumatic, no cyanosis or edema Skin: skin color, texture, turgor normal. No rashes or lesions Lymph nodes: cervical, supraclavicular, and axillary nodes normal. Neurologic: grossly normal  Pelvic: External genitalia:  no lesions              No abnormal inguinal nodes palpated.              Urethra:  normal appearing urethra with no masses, tenderness or lesions              Bartholins and Skenes: normal                 Vagina: normal appearing vagina with normal color and discharge, no lesions              Cervix: no lesions              Pap taken: {yes no:314532} Bimanual Exam:  Uterus:  normal size, contour, position, consistency, mobility, non-tender              Adnexa: no mass, fullness, tenderness              Rectal exam: {yes no:314532}.  Confirms.              Anus:  normal sphincter tone, no lesions  Chaperone was present for exam:  ***  Assessment:   Well woman visit with gynecologic exam.   Plan: Mammogram screening discussed. Self breast  awareness reviewed. Pap and HR HPV as above. Guidelines for Calcium, Vitamin D, regular exercise program including cardiovascular and weight bearing exercise.   Follow up annually and prn.   Additional counseling given.  {yes [id number]. _______ minutes face to face time of which over 50% was spent in counseling.    After visit summary provided.

## 2021-11-27 ENCOUNTER — Encounter: Payer: Self-pay | Admitting: Obstetrics and Gynecology

## 2021-11-27 ENCOUNTER — Ambulatory Visit (INDEPENDENT_AMBULATORY_CARE_PROVIDER_SITE_OTHER): Payer: BC Managed Care – PPO | Admitting: Obstetrics and Gynecology

## 2021-11-27 VITALS — BP 122/64 | HR 78 | Ht 63.0 in | Wt 127.0 lb

## 2021-11-27 DIAGNOSIS — Z23 Encounter for immunization: Secondary | ICD-10-CM | POA: Diagnosis not present

## 2021-11-27 DIAGNOSIS — Z01419 Encounter for gynecological examination (general) (routine) without abnormal findings: Secondary | ICD-10-CM | POA: Diagnosis not present

## 2021-11-27 NOTE — Patient Instructions (Signed)

## 2022-01-09 ENCOUNTER — Ambulatory Visit: Payer: BC Managed Care – PPO | Admitting: Medical

## 2022-01-09 ENCOUNTER — Other Ambulatory Visit: Payer: Self-pay | Admitting: Medical

## 2022-01-09 VITALS — BP 126/60 | HR 69 | Temp 98.0°F | Resp 18 | Ht 63.0 in | Wt 129.0 lb

## 2022-01-09 DIAGNOSIS — E785 Hyperlipidemia, unspecified: Secondary | ICD-10-CM | POA: Diagnosis not present

## 2022-01-09 DIAGNOSIS — R5383 Other fatigue: Secondary | ICD-10-CM | POA: Diagnosis not present

## 2022-01-09 DIAGNOSIS — Z0000 Encounter for general adult medical examination without abnormal findings: Secondary | ICD-10-CM | POA: Diagnosis not present

## 2022-01-09 DIAGNOSIS — Z0184 Encounter for antibody response examination: Secondary | ICD-10-CM

## 2022-01-09 MED ORDER — FLUTICASONE PROPIONATE 50 MCG/ACT NA SUSP: 2.0000 | Freq: Every day | NASAL | 1 refills | Status: DC

## 2022-01-09 NOTE — Progress Notes (Signed)
Subjective:    Patient ID: Dawn Johnson, female    DOB: 01/27/1991, 31 y.o.   MRN: [id number]  HPI  Pt wants wellness exam.     Pt in for one week of nasal congestion. She only has mild runny nose. No fevers, no chills or sweats. Getting gradually better.  Pt states she is getting annual check up.   She is going to [place].   Pt will see her dermatologist when in [place] hair loss for 6 months.  Her derm wants there to get iron, ferritin, cbc, vit D, B12, folic acid, zinc, tsh and t4.  Pt is some fatigued.      Review of Systems  Constitutional:  Negative for chills, fatigue and fever.  HENT:  Negative for congestion, drooling and ear pain.   Respiratory:  Negative for cough, chest tightness, shortness of breath and wheezing.   Cardiovascular:  Negative for chest pain and palpitations.  Gastrointestinal:  Negative for abdominal distention, abdominal pain, blood in stool and diarrhea.  Musculoskeletal:  Negative for back pain and joint swelling.  Skin:        Hair loss.  Hematological:  Negative for adenopathy. Does not bruise/bleed easily.  Psychiatric/Behavioral:  Negative for behavioral problems and confusion.     No past medical history on file.   Social History   Socioeconomic History   Marital status: Married    Spouse name: Not on file   Number of children: Not on file   Years of education: Not on file   Highest education level: Not on file  Occupational History   Not on file  Tobacco Use   Smoking status: Never   Smokeless tobacco: Never  Vaping Use   Vaping Use: Never used  Substance and Sexual Activity   Alcohol use: Yes    Alcohol/week: 3.0 standard drinks of alcohol    Types: 3 Cans of beer per week   Drug use: Never   Sexual activity: Yes    Birth control/protection: I.U.D.    Comment: paragard IUD 06/2019  Other Topics Concern   Not on file  Social History Narrative   Not on file   Social Determinants of Health   Financial Resource  Strain: Not on file  Food Insecurity: Not on file  Transportation Needs: Not on file  Physical Activity: Not on file  Stress: Not on file  Social Connections: Not on file  Intimate Partner Violence: Not on file    No past surgical history on file.  Family History  Problem Relation Age of Onset   Thyroid disease Mother    Hypertension Father    Breast cancer Paternal Grandmother        unsure age of onset    No Known Allergies  Current Outpatient Medications on File Prior to Visit  Medication Sig Dispense Refill   PARAGARD INTRAUTERINE COPPER IU by Intrauterine route.     No current facility-administered medications on file prior to visit.    BP 126/60   Pulse 69   Temp 98 F (36.7 C)   Resp 18   Ht 5\' 3"  (1.6 m)   Wt 129 lb (58.5 kg)   LMP 01/05/2022   SpO2 100%   BMI 22.85 kg/m        Objective:   Physical Exam  General Mental Status- Alert. General Appearance- Not in acute distress.   Skin General: Color- Normal Color. Moisture- Normal Moisture.  Neck Carotid Arteries- Normal color. Moisture- Normal Moisture. No carotid  bruits. No JVD.  Chest and Lung Exam Auscultation: Breath Sounds:-Normal.  Cardiovascular Auscultation:Rythm- Regular. Murmurs & Other Heart Sounds:Auscultation of the heart reveals- No Murmurs.  Abdomen Inspection:-Inspeection Normal. Palpation/Percussion:Note:No mass. Palpation and Percussion of the abdomen reveal- Non Tender, Non Distended + BS, no rebound or guarding.   Neurologic Cranial Nerve exam:- CN III-XII intact(No nystagmus), symmetric smile. Strength:- 5/5 equal and symmetric strength both upper and lower extremities.       Assessment & Plan:   Patient Instructions  For you wellness exam today I have ordered cbc, cmp and  lipid panel.  For fatigue and hair loss did place extra labs. I would recommend getting vit D, zinc and folic acid in [place] due to needing dx.  Flu vaccine up to date. Check on tdap so we  can update if due.  Recommend exercise and healthy diet.  We will let you know lab results as they come in.  Rx flonase for nasal congestion. Appears recent uri vs allergies.  Follow up date appointment will be determined after lab review.       Esperanza Richters, PA-C

## 2022-01-09 NOTE — Patient Instructions (Addendum)
For you wellness exam today I have ordered cbc, cmp and  lipid panel.  For fatigue and hair loss did place extra labs. I would recommend getting vit D, zinc and folic acid in [place] due to needing dx.  Flu vaccine up to date. Check on tdap so we can update if due.  Recommend exercise and healthy diet.  We will let you know lab results as they come in.  Rx flonase for nasal congestion. Appears recent uri vs allergies.  Follow up date appointment will be determined after lab review.     Preventive Care 25-13 Years Old, Female Preventive care refers to lifestyle choices and visits with your health care provider that can promote health and wellness. Preventive care visits are also called wellness exams. What can I expect for my preventive care visit? Counseling During your preventive care visit, your health care provider may ask about your: Medical history, including: Past medical problems. Family medical history. Pregnancy history. Current health, including: Menstrual cycle. Method of birth control. Emotional well-being. Home life and relationship well-being. Sexual activity and sexual health. Lifestyle, including: Alcohol, nicotine or tobacco, and drug use. Access to firearms. Diet, exercise, and sleep habits. Work and work Statistician. Sunscreen use. Safety issues such as seatbelt and bike helmet use. Physical exam Your health care provider may check your: Height and weight. These may be used to calculate your BMI (body mass index). BMI is a measurement that tells if you are at a healthy weight. Waist circumference. This measures the distance around your waistline. This measurement also tells if you are at a healthy weight and may help predict your risk of certain diseases, such as type 2 diabetes and high blood pressure. Heart rate and blood pressure. Body temperature. Skin for abnormal spots. What immunizations do I need?  Vaccines are usually given at various ages,  according to a schedule. Your health care provider will recommend vaccines for you based on your age, medical history, and lifestyle or other factors, such as travel or where you work. What tests do I need? Screening Your health care provider may recommend screening tests for certain conditions. This may include: Pelvic exam and Pap test. Lipid and cholesterol levels. Diabetes screening. This is done by checking your blood sugar (glucose) after you have not eaten for a while (fasting). Hepatitis B test. Hepatitis C test. HIV (human immunodeficiency virus) test. STI (sexually transmitted infection) testing, if you are at risk. BRCA-related cancer screening. This may be done if you have a family history of breast, ovarian, tubal, or peritoneal cancers. Talk with your health care provider about your test results, treatment options, and if necessary, the need for more tests. Follow these instructions at home: Eating and drinking  Eat a healthy diet that includes fresh fruits and vegetables, whole grains, lean protein, and low-fat dairy products. Take vitamin and mineral supplements as recommended by your health care provider. Do not drink alcohol if: Your health care provider tells you not to drink. You are pregnant, may be pregnant, or are planning to become pregnant. If you drink alcohol: Limit how much you have to 0-1 drink a day. Know how much alcohol is in your drink. In the [place] one drink equals one 12 oz bottle of beer (355 mL), one 5 oz glass of wine (148 mL), or one 1 oz glass of hard liquor (44 mL). Lifestyle Brush your teeth every morning and night with fluoride toothpaste. Floss one time each day. Exercise for at least 30 minutes  5 or more days each week. Do not use any products that contain nicotine or tobacco. These products include cigarettes, chewing tobacco, and vaping devices, such as e-cigarettes. If you need help quitting, ask your health care provider. Do not use  drugs. If you are sexually active, practice safe sex. Use a condom or other form of protection to prevent STIs. If you do not wish to become pregnant, use a form of birth control. If you plan to become pregnant, see your health care provider for a prepregnancy visit. Find healthy ways to manage stress, such as: Meditation, yoga, or listening to music. Journaling. Talking to a trusted person. Spending time with friends and family. Minimize exposure to UV radiation to reduce your risk of skin cancer. Safety Always wear your seat belt while driving or riding in a vehicle. Do not drive: If you have been drinking alcohol. Do not ride with someone who has been drinking. If you have been using any mind-altering substances or drugs. While texting. When you are tired or distracted. Wear a helmet and other protective equipment during sports activities. If you have firearms in your house, make sure you follow all gun safety procedures. Seek help if you have been physically or sexually abused. What's next? Go to your health care provider once a year for an annual wellness visit. Ask your health care provider how often you should have your eyes and teeth checked. Stay up to date on all vaccines. This information is not intended to replace advice given to you by your health care provider. Make sure you discuss any questions you have with your health care provider. Document Revised: 07/24/2020 Document Reviewed: 07/24/2020 Elsevier Patient Education  [place].

## 2022-01-09 NOTE — Addendum Note (Signed)
Addended by: Rosita Kea on: 01/09/2022 03:40 PM   Modules accepted: Orders

## 2022-01-10 LAB — COMPREHENSIVE METABOLIC PANEL
AG Ratio: 1.8 (calc) (ref 1.0–2.5)
ALT: 12 U/L (ref 6–29)
AST: 13 U/L (ref 10–30)
Albumin: 4.3 g/dL (ref 3.6–5.1)
Alkaline phosphatase (APISO): 54 U/L (ref 31–125)
BUN: 14 mg/dL (ref 7–25)
CO2: 26 mmol/L (ref 20–32)
Calcium: 9.2 mg/dL (ref 8.6–10.2)
Chloride: 104 mmol/L (ref 98–110)
Creat: 0.77 mg/dL (ref 0.50–0.97)
Globulin: 2.4 g/dL (calc) (ref 1.9–3.7)
Glucose, Bld: 80 mg/dL (ref 65–99)
Potassium: 3.8 mmol/L (ref 3.5–5.3)
Sodium: 139 mmol/L (ref 135–146)
Total Bilirubin: 0.3 mg/dL (ref 0.2–1.2)
Total Protein: 6.7 g/dL (ref 6.1–8.1)

## 2022-01-10 LAB — CBC WITH DIFFERENTIAL/PLATELET
Absolute Monocytes: 581 cells/uL (ref 200–950)
Basophils Absolute: 50 cells/uL (ref 0–200)
Basophils Relative: 0.6 %
Eosinophils Absolute: 208 cells/uL (ref 15–500)
Eosinophils Relative: 2.5 %
HCT: 39.3 % (ref 35.0–45.0)
Hemoglobin: 13.5 g/dL (ref 11.7–15.5)
Lymphs Abs: 2357 cells/uL (ref 850–3900)
MCH: 30.6 pg (ref 27.0–33.0)
MCHC: 34.4 g/dL (ref 32.0–36.0)
MCV: 89.1 fL (ref 80.0–100.0)
MPV: 10.4 fL (ref 7.5–12.5)
Monocytes Relative: 7 %
Neutro Abs: 5105 cells/uL (ref 1500–7800)
Neutrophils Relative %: 61.5 %
Platelets: 287 10*3/uL (ref 140–400)
RBC: 4.41 10*6/uL (ref 3.80–5.10)
RDW: 12.4 % (ref 11.0–15.0)
Total Lymphocyte: 28.4 %
WBC: 8.3 10*3/uL (ref 3.8–10.8)

## 2022-01-10 LAB — TSH: TSH: 1.87 mIU/L

## 2022-01-10 LAB — FERRITIN: Ferritin: 52 ng/mL (ref 16–154)

## 2022-01-10 LAB — LIPID PANEL
Cholesterol: 181 mg/dL (ref <200)
HDL: 97 mg/dL (ref >=50)
LDL Cholesterol (Calc): 65 mg/dL (calc)
Non-HDL Cholesterol (Calc): 84 mg/dL (calc) (ref <130)
Total CHOL/HDL Ratio: 1.9 (calc) (ref <5.0)
Triglycerides: 104 mg/dL (ref <150)

## 2022-01-10 LAB — VITAMIN B12: Vitamin B-12: 439 pg/mL (ref 200–1100)

## 2022-01-10 LAB — IRON: Iron: 32 ug/dL — ABNORMAL LOW (ref 40–190)

## 2022-01-10 LAB — T4, FREE: Free T4: 1 ng/dL (ref 0.8–1.8)

## 2022-01-11 ENCOUNTER — Other Ambulatory Visit: Payer: Self-pay | Admitting: Medical

## 2022-01-11 MED ORDER — IRON (FERROUS SULFATE) 325 (65 FE) MG PO TABS: ORAL_TABLET | ORAL | 0 refills | Status: DC

## 2022-01-11 NOTE — Addendum Note (Signed)
Addended by: Gwenevere Abbot on: 01/11/2022 02:40 PM   Modules accepted: Orders

## 2022-01-14 LAB — VITAMIN B1: Vitamin B1 (Thiamine): 16 nmol/L (ref 8–30)

## 2022-05-11 ENCOUNTER — Telehealth: Payer: Self-pay

## 2022-05-11 NOTE — Telephone Encounter (Signed)
Initial Comment Caller states his wife is experiencing ear pain. He believes it is due to her sinus. He said they are travelling but gave me their home zip code he said he is close Translation No Nurse Assessment Nurse: Patsey Berthold, RN, Lattie Haw Date/Time (Eastern Time): 05/08/2022 7:38:13 AM Confirm and document reason for call. If symptomatic, describe symptoms. ---Caller states is experiencing ear pain. R ear is stuffed and pain started yesterday. Denies fever. Does the patient have any new or worsening symptoms? ---Yes Will a triage be completed? ---Yes Related visit to physician within the last 2 weeks? ---No Does the PT have any chronic conditions? (i.e. diabetes, asthma, this includes High risk factors for pregnancy, etc.) ---No Is the patient pregnant or possibly pregnant? (Ask all females between the ages of 56-55) ---No Is this a behavioral health or substance abuse call? ---No Guidelines Guideline Title Affirmed Question Affirmed Notes Nurse Date/Time (Eastern Time) Earache Earache (Exceptions: brief ear pain of < 60 minutes duration, earache occurring during air travel [name] 05/08/2022 7:42:05 AM PLEASE NOTE: All timestamps contained within this report are represented as [place] Standard Time. CONFIDENTIALTY NOTICE: This fax transmission is intended only for the addressee. It contains information that is legally privileged, confidential or otherwise protected from use or disclosure. If you are not the intended recipient, you are strictly prohibited from reviewing, disclosing, copying using or disseminating any of this information or taking any action in reliance on or regarding this information. If you have received this fax in error, please notify us immediately by telephone so that we can arrange for its return to [place]. Phone: [phone], Toll-Free: [phone], Fax: [phone] Page: 2 of 2 Call Id: [id number] [place]. Time Eilene Ghazi Time) Disposition Final  User 05/08/2022 7:49:34 AM See PCP within 24 Hours Yes Patsey Berthold RN, Lattie Haw Final Disposition 05/08/2022 7:49:34 AM See PCP within 24 Hours Yes Patsey Berthold, RN, Leland Johns Disagree/Comply Comply Caller Understands Yes PreDisposition Call Doctor Care Advice Given Per Guideline SEE PCP WITHIN 24 HOURS: * IF OFFICE WILL BE OPEN: You need to be examined within the next 24 hours. Call your doctor (or NP/PA) when the office opens and make an appointment. COLD PACK FOR EAR PAIN: * Apply a cold pack or a cold wet washcloth to outer ear for 20 minutes to reduce pain while medicine takes effect. Note: Some adults prefer local heat for 20 minutes. EAR DISCHARGE: * Wipe the discharge away as it appears. * Avoid plugging with cotton. Reason: Retained pus can cause infection of the lining of the ear canal. CALL BACK IF: * Severe pain persists over 2 hours after pain medicine * You become worse CARE ADVICE given per Earache (Adult) guideline. Referrals REFERRED TO PCP OFFICE

## 2022-05-11 NOTE — Telephone Encounter (Signed)
Pt called and lvm to return call 

## 2022-09-14 IMAGING — MG DIGITAL DIAGNOSTIC BILAT W/ TOMO W/ CAD
6 of 10 series · 6 of 30 positions shown · non-contrast
Comparison: No prior mammogram.

CLINICAL DATA: 29-year-old with a possible palpable lump in the
UPPER INNER QUADRANT of the LEFT breast which the patient states has
not changed since she presented with a similar complaint in June 2018, at which time an ultrasound showed normal dense fibroglandular
tissue.

This is the patient's initial baseline mammogram. Family history of
breast cancer in her paternal grandmother.
EXAM:
DIGITAL DIAGNOSTIC BILATERAL MAMMOGRAM WITH TOMOSYNTHESIS AND CAD;
ULTRASOUND LEFT BREAST LIMITED
TECHNIQUE: Bilateral digital diagnostic mammography and breast tomosynthesis
was performed. The images were evaluated with computer-aided
detection.; Targeted ultrasound examination of the left breast was
performed.

[L TAN synth-2D]
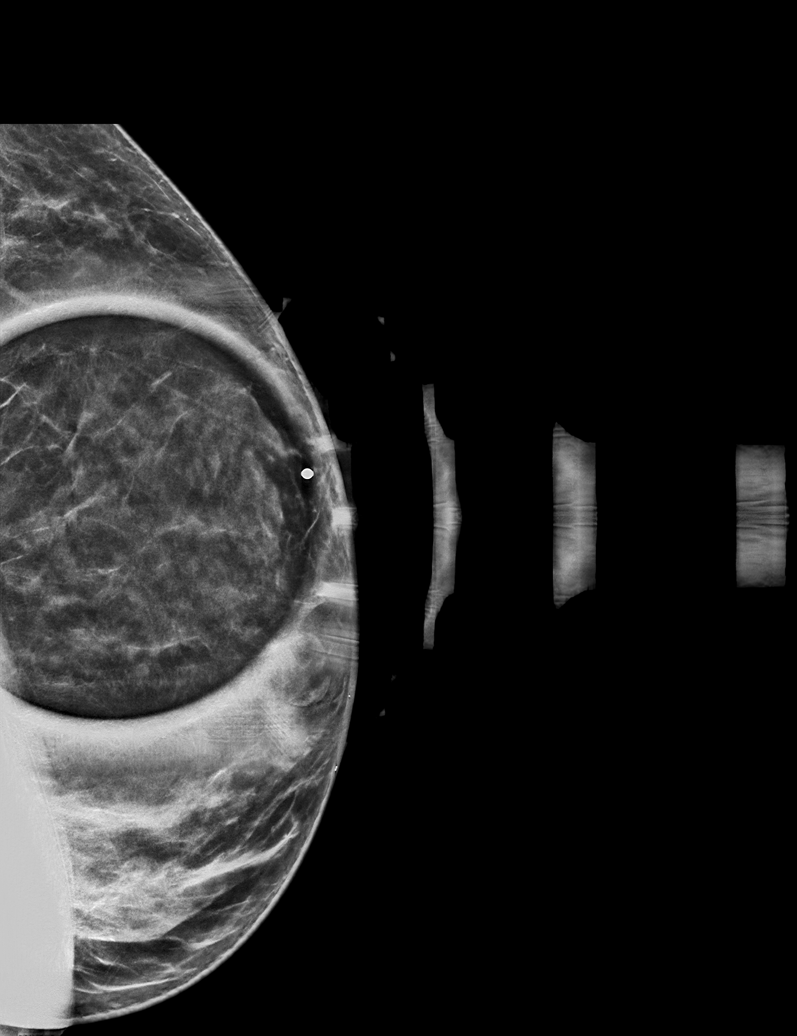

[L MLO synth-2D]
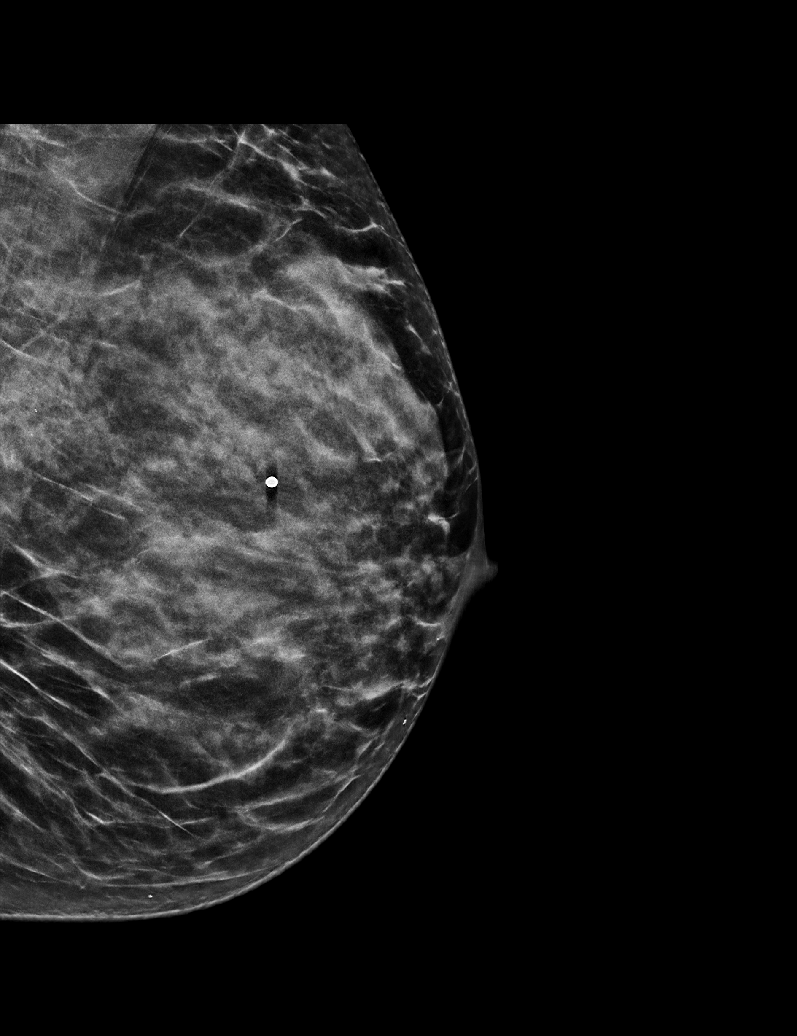

[R MLO synth-2D]
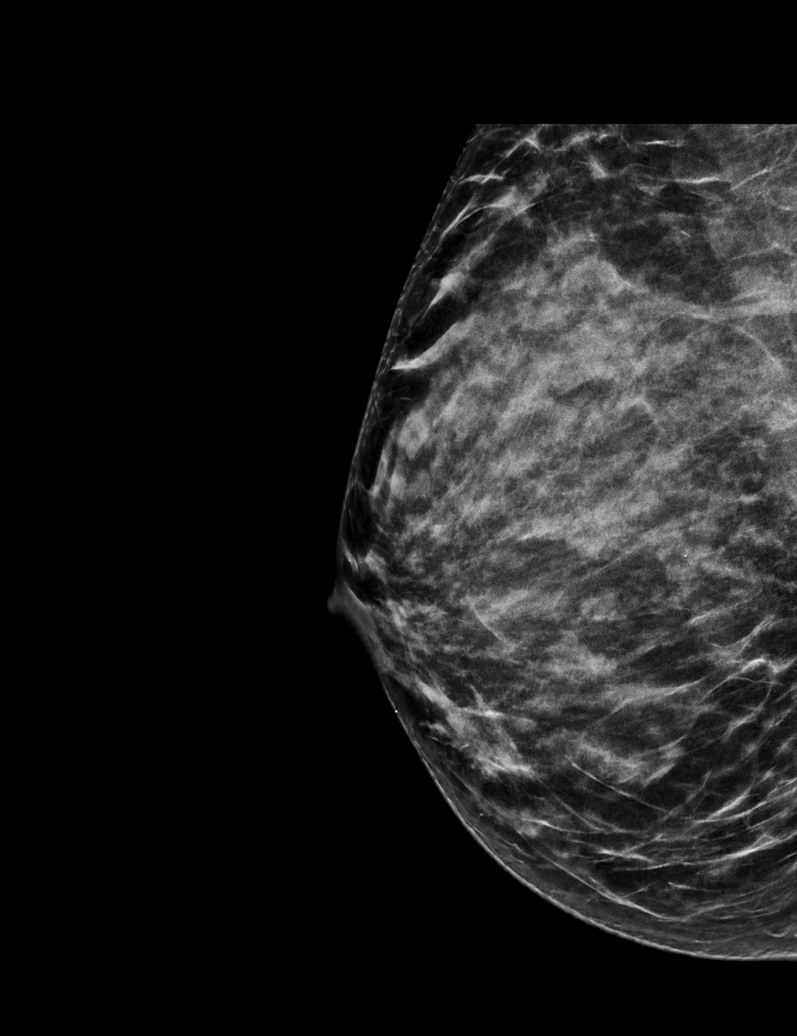

[R CC synth-2D]
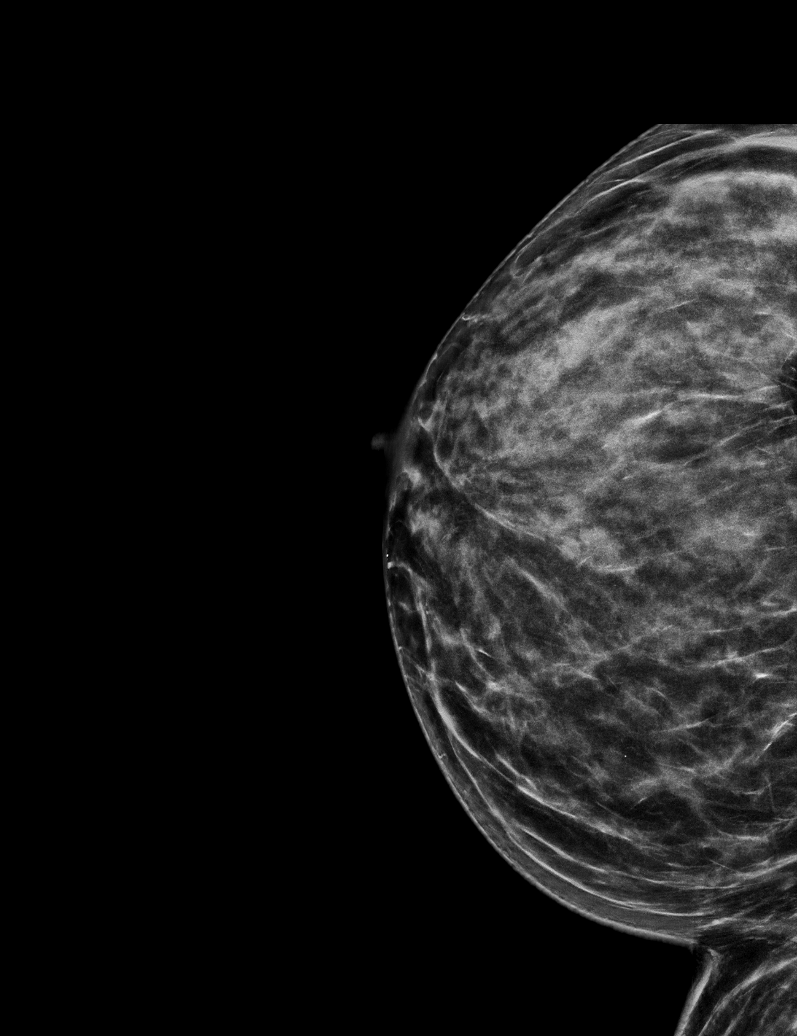

[L CC synth-2D]
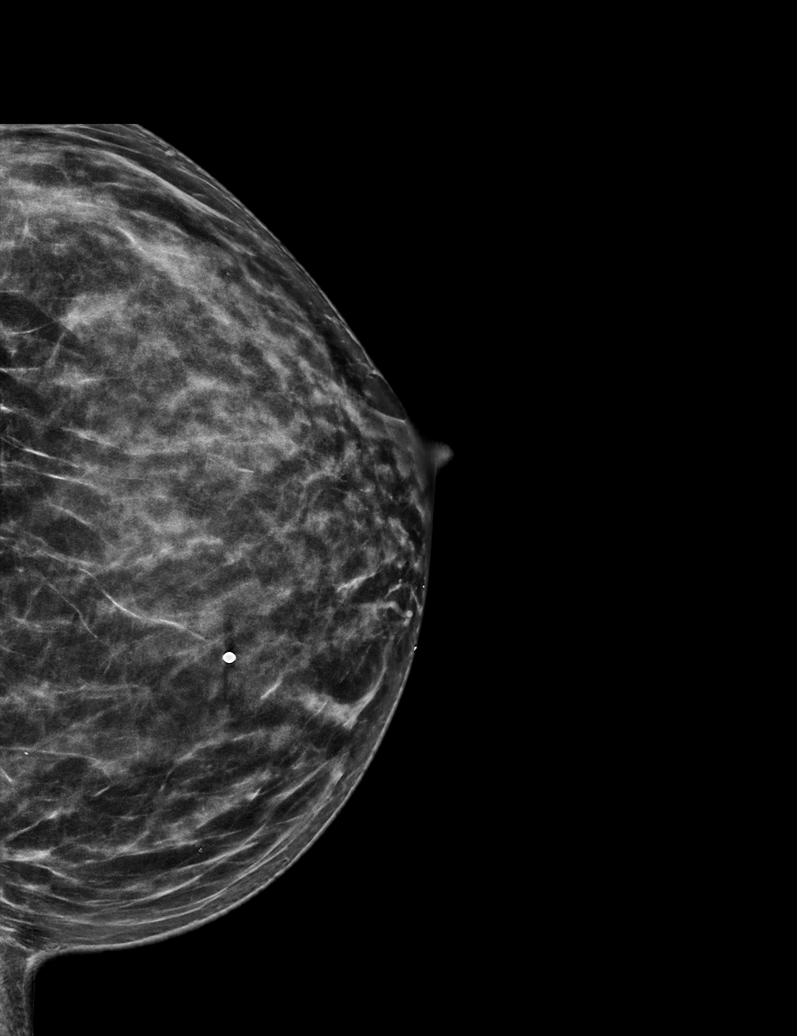

[L CC tomo · tomo slice 29/57.0]
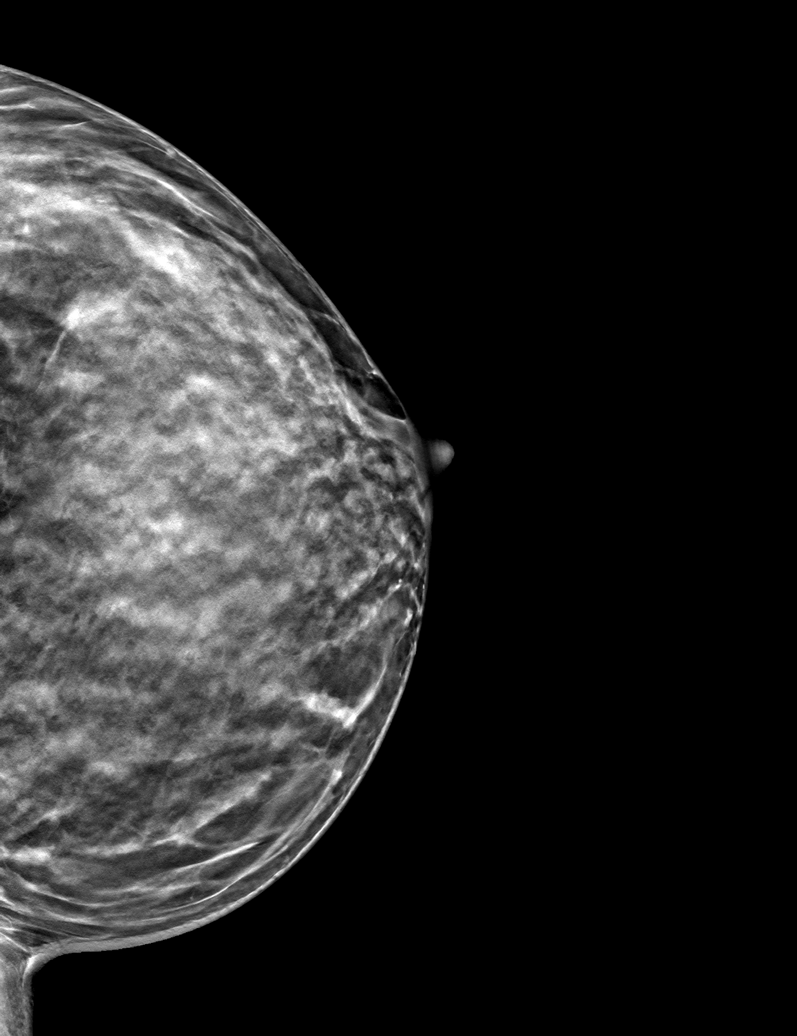

[6 of 30 positions shown; findings below may reference images not displayed]

LEFT breast ultrasound 06/28/2018.

ACR Breast Density Category d: The breast tissue is extremely dense,
which lowers the sensitivity of mammography.
FINDINGS: RIGHT: No findings suspicious for malignancy.

LEFT: No findings suspicious for malignancy. The spot tangential
images demonstrate normal dense fibroglandular tissue in the UPPER
INNER QUADRANT at the site of palpable concern.

Targeted ultrasound is performed in the area of palpable concern,
demonstrating normal dense fibroglandular tissue at the 10 o'clock
position approximately 4 cm from the nipple. No cyst, solid mass or
abnormal acoustic shadowing is identified.

On correlative physical exam, there is a palpable ridge of tissue in
the UPPER INNER QUADRANT corresponding to what the patient is
feeling, though I do not palpate a discrete mass.
IMPRESSION: 1. No mammographic or sonographic evidence of malignancy involving
the LEFT breast.
2. No mammographic evidence of malignancy involving the RIGHT
breast.

RECOMMENDATION:
Screening mammogram at age 38 unless there are persistent or
subsequent clinical concerns. (Code:[id number]-R-93K)

I have discussed the findings and recommendations with the patient.
Communication with the patient was achieved with the assistance of a
certified interpreter.

BI-RADS CATEGORY  1: Negative.

## 2022-12-04 ENCOUNTER — Ambulatory Visit (INDEPENDENT_AMBULATORY_CARE_PROVIDER_SITE_OTHER): Payer: BC Managed Care – PPO | Admitting: Obstetrics and Gynecology

## 2022-12-04 ENCOUNTER — Other Ambulatory Visit (HOSPITAL_COMMUNITY)
Admission: RE | Admit: 2022-12-04 | Discharge: 2022-12-04 | Disposition: A | Discharge status: Home / Self Care | Payer: BC Managed Care – PPO | Source: Ambulatory Visit | Attending: Obstetrics and Gynecology | Admitting: Obstetrics and Gynecology

## 2022-12-04 ENCOUNTER — Encounter: Payer: Self-pay | Admitting: Obstetrics and Gynecology

## 2022-12-04 VITALS — BP 122/70 | HR 98 | Ht 64.0 in | Wt 125.0 lb

## 2022-12-04 DIAGNOSIS — Z124 Encounter for screening for malignant neoplasm of cervix: Secondary | ICD-10-CM | POA: Insufficient documentation

## 2022-12-04 DIAGNOSIS — Z01419 Encounter for gynecological examination (general) (routine) without abnormal findings: Secondary | ICD-10-CM | POA: Insufficient documentation

## 2022-12-04 DIAGNOSIS — Z975 Presence of (intrauterine) contraceptive device: Secondary | ICD-10-CM | POA: Insufficient documentation

## 2022-12-04 NOTE — Progress Notes (Signed)
32 y.o. G83P1001 female with Paraguard IUD (placed May 2021), hx of left breast sensitivity (normal dx imaging 2022) here for annual exam. 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 Has been in the [place] for 5 years.  Monthly cycle.  Abnormal bleeding: no Pelvic discharge or pain: no Breast mass, nipple discharge or skin changes : no Birth control: IUD Last PAP:     Component Value Date/Time   DIAGPAP  07/25/2019 1637    - Negative for intraepithelial lesion or malignancy (NILM)   HPVHIGH Negative 07/25/2019 1637   ADEQPAP  07/25/2019 1637    Satisfactory for evaluation; transformation zone component PRESENT.   Gardasil: completed Sexually active: yes  Exercising: yes, runs twice a  week and goes to he gym  GYN HISTORY: No sig history  OB History  Gravida Para Term Preterm AB Living  1 1 1  0 0 1  SAB IAB Ectopic Multiple Live Births  0 0 0 0 1    # Outcome Date GA Lbr Len/2nd Weight Sex Type Anes PTL Lv  1 Term 05/09/19 [redacted]w[redacted]d 08:27 / 02:26 5 lb 14.7 oz (2.685 kg) F Vag-Spont EPI  LIV    History reviewed. No pertinent past medical history.  History reviewed. No pertinent surgical history.  Current Outpatient Medications on File Prior to Visit  Medication Sig Dispense Refill   PARAGARD INTRAUTERINE COPPER IU by Intrauterine route.     No current facility-administered medications on file prior to visit.    Social History   Socioeconomic History   Marital status: Married    Spouse name: Not on file   Number of children: Not on file   Years of education: Not on file   Highest education level: Not on file  Occupational History   Not on file  Tobacco Use   Smoking status: Never   Smokeless tobacco: Never  Vaping Use   Vaping status: Never Used  Substance and Sexual Activity   Alcohol use: Yes    Alcohol/week: 3.0 standard drinks of alcohol    Types: 3 Cans of beer per week   Drug use: Never   Sexual activity: Yes     Birth control/protection: I.U.D.    Comment: paragard IUD 06/2019  Other Topics Concern   Not on file  Social History Narrative   Not on file   Social Determinants of Health   Financial Resource Strain: Not on file  Food Insecurity: Not on file  Transportation Needs: Not on file  Physical Activity: Not on file  Stress: Not on file  Social Connections: Not on file  Intimate Partner Violence: Not on file    Family History  Problem Relation Age of Onset   Thyroid disease Mother    Hypertension Father    Breast cancer Paternal Grandmother        unsure age of onset    No Known Allergies    PE Today's Vitals   12/04/22 1150  BP: 122/70  Pulse: 98  SpO2: 99%  Weight: 125 lb (56.7 kg)  Height: 5\' 4"  (1.626 m)   Body mass index is 21.46 kg/m.  Physical Exam Vitals reviewed. Exam conducted with a chaperone present.  Constitutional:      General: She is not in acute distress.    Appearance: Normal appearance.  HENT:     Head: Normocephalic and atraumatic.     Nose: Nose normal.  Eyes:     Extraocular Movements: Extraocular movements  intact.     Conjunctiva/sclera: Conjunctivae normal.  Neck:     Thyroid: No thyroid mass, thyromegaly or thyroid tenderness.  Pulmonary:     Effort: Pulmonary effort is normal.  Chest:     Chest wall: No mass or tenderness.  Breasts:    Right: Normal. No swelling, mass, nipple discharge, skin change or tenderness.     Left: Normal. No swelling, mass, nipple discharge, skin change or tenderness.  Abdominal:     General: There is no distension.     Palpations: Abdomen is soft.     Tenderness: There is no abdominal tenderness.  Genitourinary:    General: Normal vulva.     Exam position: Lithotomy position.     Urethra: No prolapse.     Vagina: Normal. No vaginal discharge or bleeding.     Cervix: Normal. No cervical motion tenderness, discharge or lesion.     Uterus: Normal. Not enlarged and not tender.      Adnexa: Right adnexa  normal and left adnexa normal.     Comments: IUD strings present. Musculoskeletal:        General: Normal range of motion.     Cervical back: Normal range of motion.  Lymphadenopathy:     Upper Body:     Right upper body: No axillary adenopathy.     Left upper body: No axillary adenopathy.     Lower Body: No right inguinal adenopathy. No left inguinal adenopathy.  Skin:    General: Skin is warm and dry.  Neurological:     General: No focal deficit present.     Mental Status: She is alert.  Psychiatric:        Mood and Affect: Mood normal.        Behavior: Behavior normal.       Assessment and Plan:        Cervical cancer screening -     Cytology - PAP  Well woman exam with routine gynecological exam Assessment & Plan: Cervical cancer screening performed according to ASCCP guidelines. Labs and immunizations with her primary Encouraged safe sexual practices as indicated Encouraged healthy lifestyle practices with diet and exercise For patients under 50yo, I recommend 1000mg  calcium daily and 600IU of vitamin D daily.    IUD contraception  Continue with Copper IUD, placed 2021  Rosalyn Gess, MD

## 2022-12-04 NOTE — Assessment & Plan Note (Signed)
 Cervical cancer screening performed according to ASCCP guidelines. Labs and immunizations with her primary Encouraged safe sexual practices as indicated Encouraged healthy lifestyle practices with diet and exercise For patients under 34yo, I recommend 1000mg  calcium daily and 600IU of vitamin D daily.

## 2022-12-04 NOTE — Patient Instructions (Signed)

## 2022-12-10 LAB — CYTOLOGY - PAP
Comment: NEGATIVE
Diagnosis: NEGATIVE
Diagnosis: REACTIVE
High risk HPV: NEGATIVE

## 2023-05-21 ENCOUNTER — Telehealth: Payer: Self-pay

## 2023-05-21 NOTE — Telephone Encounter (Signed)
 Copied from CRM [id number]. Topic: Clinical - Medication Question >> May 21, 2023 11:07 AM Adaysia C wrote: Reason for CRM: Patients husband(Cassiano Rosa) called on patients behave because she has requested the VARICELLA vaccine; VARICELLA vaccine is not listed on the list of vaccines for this clinic in the [facility] resources; please follow up with patients husband(Cassiano Rosa) if she can receive this vaccine at this clinic [phone]

## 2023-05-22 NOTE — Addendum Note (Signed)
 Addended by: Serafina Damme on: 05/22/2023 07:44 AM   Modules accepted: Orders

## 2023-05-25 NOTE — Telephone Encounter (Signed)
 Bf called and lvm to return call in regards to lab visit

## 2023-05-26 ENCOUNTER — Ambulatory Visit (INDEPENDENT_AMBULATORY_CARE_PROVIDER_SITE_OTHER)

## 2023-05-26 ENCOUNTER — Other Ambulatory Visit (INDEPENDENT_AMBULATORY_CARE_PROVIDER_SITE_OTHER)

## 2023-05-26 DIAGNOSIS — Z0184 Encounter for antibody response examination: Secondary | ICD-10-CM

## 2023-05-26 DIAGNOSIS — Z23 Encounter for immunization: Secondary | ICD-10-CM | POA: Diagnosis not present

## 2023-05-26 NOTE — Telephone Encounter (Signed)
 Pt called into office to make lab and nv for tdap

## 2023-05-26 NOTE — Progress Notes (Signed)
 Pt in office today for a Tdap vaccine.  Vaccine was given in R deltoid, pt tolerated well.

## 2023-05-27 ENCOUNTER — Encounter: Payer: Self-pay | Admitting: Medical

## 2023-05-27 LAB — VARICELLA ZOSTER ANTIBODY, IGG: Varicella IgG: 8.53 {s_co_ratio}

## 2023-10-28 ENCOUNTER — Encounter: Payer: Self-pay | Admitting: Radiology

## 2023-10-28 ENCOUNTER — Ambulatory Visit: Admitting: Radiology

## 2023-10-28 VITALS — BP 116/64 | HR 77 | Wt 129.0 lb

## 2023-10-28 DIAGNOSIS — N760 Acute vaginitis: Secondary | ICD-10-CM

## 2023-10-28 DIAGNOSIS — Z30432 Encounter for removal of intrauterine contraceptive device: Secondary | ICD-10-CM

## 2023-10-28 LAB — WET PREP FOR TRICH, YEAST, CLUE

## 2023-10-28 NOTE — Progress Notes (Signed)
      Subjective: Dawn Johnson is a 33 y.o. female who desires pregnancy, would like Paragard  removal. Complains of irritation, no discharge, no odor. Symptoms x's 4 days. (Took fluconazole last month in [place] for yeast infection).   Consent signed, timeout completed.  Review of Systems  All other systems reviewed and are negative.   No past medical history on file.    Objective:  Today's Vitals   10/28/23 1401  BP: 116/64  Pulse: 77  SpO2: 98%  Weight: 129 lb (58.5 kg)   Body mass index is 22.14 kg/m.   Physical Exam Vitals and nursing note reviewed. Exam conducted with a chaperone present.  Constitutional:      Appearance: Normal appearance. She is well-developed.  Pulmonary:     Effort: Pulmonary effort is normal.  Abdominal:     General: Abdomen is flat.     Palpations: Abdomen is soft.  Genitourinary:    General: Normal vulva.     Vagina: Vaginal discharge present. No erythema, bleeding or lesions.     Cervix: Normal. No discharge, friability, lesion or erythema.     Uterus: Normal.      Adnexa: Right adnexa normal and left adnexa normal.     Comments: IUD removed easily with ring forceps Neurological:     Mental Status: She is alert.  Psychiatric:        Mood and Affect: Mood normal.        Thought Content: Thought content normal.        Judgment: Judgment normal.    Microscopic wet-mount exam shows negative for pathogens, normal epithelial cells.   Darice Hoit, CMA present for exam  Assessment:/Plan:   1. Encounter for IUD removal (Primary) Begin PNV and cycle tracking  2. Recurrent vaginitis - WET PREP FOR TRICH, YEAST, CLUE Reassured wet prep negative  Avoid intercourse until symptoms are resolved. Safe sex encouraged. Avoid the use of soaps or perfumed products in the peri area. Avoid tub baths and sitting in sweaty or wet clothing for prolonged periods of time.   Return if symptoms worsen or fail to improve.   Shawnelle Spoerl B,  NP 2:11 PM

## 2023-11-05 ENCOUNTER — Ambulatory Visit: Admitting: Radiology

## 2023-11-05 ENCOUNTER — Encounter: Payer: Self-pay | Admitting: Radiology

## 2023-11-05 VITALS — BP 110/66 | Wt 128.0 lb

## 2023-11-05 DIAGNOSIS — N760 Acute vaginitis: Secondary | ICD-10-CM

## 2023-11-05 MED ORDER — NYSTATIN-TRIAMCINOLONE 100000-0.1 UNIT/GM-% EX OINT: 1.0000 | TOPICAL_OINTMENT | Freq: Two times a day (BID) | CUTANEOUS | 0 refills | Status: DC

## 2023-11-05 MED ORDER — FLUCONAZOLE 150 MG PO TABS: 150.0000 mg | ORAL_TABLET | ORAL | 0 refills | Status: DC

## 2023-11-05 NOTE — Progress Notes (Signed)
      Subjective: Dawn Johnson is a 33 y.o. female who complains of vaginal irritation, itching, no discharge. Symptoms persisted since last office visit, negative wet prep 10/28/23.   Review of Systems  All other systems reviewed and are negative.   History reviewed. No pertinent past medical history.    Objective:  Today's Vitals   11/05/23 1410  BP: 110/66  Weight: 128 lb (58.1 kg)   Body mass index is 21.97 kg/m.   Physical Exam Vitals and nursing note reviewed. Exam conducted with a chaperone present.  Constitutional:      Appearance: Normal appearance. She is well-developed.  Pulmonary:     Effort: Pulmonary effort is normal.  Abdominal:     General: Abdomen is flat.     Palpations: Abdomen is soft.  Genitourinary:    General: Normal vulva.     Vagina: Vaginal discharge present. No erythema, bleeding or lesions.     Cervix: Normal. No discharge, friability, lesion or erythema.     Uterus: Normal.      Adnexa: Right adnexa normal and left adnexa normal.  Neurological:     Mental Status: She is alert.  Psychiatric:        Mood and Affect: Mood normal.        Thought Content: Thought content normal.        Judgment: Judgment normal.     Darice Hoit, CMA present for exam  Assessment:/Plan:   1. Recurrent vaginitis (Primary) - SureSwab Advanced Vaginitis, TMA - fluconazole  (DIFLUCAN ) 150 MG tablet; Take 1 tablet (150 mg total) by mouth every 3 (three) days.  Dispense: 2 tablet; Refill: 0 - nystatin -triamcinolone  ointment (MYCOLOG); Apply 1 Application topically 2 (two) times daily.  Dispense: 30 g; Refill: 0    Will contact patient with results of testing completed today. Avoid intercourse until symptoms are resolved. Safe sex encouraged. Avoid the use of soaps or perfumed products in the peri area. Avoid tub baths and sitting in sweaty or wet clothing for prolonged periods of time.     Shanee Batch B, NP 2:16 PM

## 2023-11-06 LAB — SURESWAB® ADVANCED VAGINITIS,TMA
CANDIDA SPECIES: DETECTED — AB
Candida glabrata: NOT DETECTED
SURESWAB(R) ADV BACTERIAL VAGINOSIS(BV),TMA: NEGATIVE
TRICHOMONAS VAGINALIS (TV),TMA: NOT DETECTED

## 2023-11-08 ENCOUNTER — Ambulatory Visit: Payer: Self-pay | Admitting: Radiology

## 2023-11-12 ENCOUNTER — Ambulatory Visit: Admitting: Radiology

## 2023-11-22 DIAGNOSIS — Z3200 Encounter for pregnancy test, result unknown: Secondary | ICD-10-CM | POA: Diagnosis not present

## 2023-12-01 DIAGNOSIS — O209 Hemorrhage in early pregnancy, unspecified: Secondary | ICD-10-CM | POA: Diagnosis not present

## 2023-12-03 DIAGNOSIS — Z3A00 Weeks of gestation of pregnancy not specified: Secondary | ICD-10-CM | POA: Diagnosis not present

## 2023-12-03 DIAGNOSIS — O209 Hemorrhage in early pregnancy, unspecified: Secondary | ICD-10-CM | POA: Diagnosis not present

## 2023-12-07 DIAGNOSIS — O0281 Inappropriate change in quantitative human chorionic gonadotropin (hCG) in early pregnancy: Secondary | ICD-10-CM | POA: Diagnosis not present

## 2023-12-21 DIAGNOSIS — O0281 Inappropriate change in quantitative human chorionic gonadotropin (hCG) in early pregnancy: Secondary | ICD-10-CM | POA: Diagnosis not present

## 2023-12-22 ENCOUNTER — Ambulatory Visit: Admitting: Obstetrics and Gynecology

## 2023-12-22 NOTE — Progress Notes (Incomplete)
 33 y.o. G5P1001 female here for annual exam. Married. PCP: Dorina Loving, PA-C  No LMP recorded. (Menstrual status: IUD).    She reports ***. Urine sample provided: ***  Abnormal bleeding: *** Pelvic discharge or pain: *** Breast mass, nipple discharge or skin changes : ***  Sexually active: ***  Birth control: *** Last PAP:     Component Value Date/Time   DIAGPAP  12/04/2022 1233    - Negative for Intraepithelial Lesions or Malignancy (NILM)   DIAGPAP - Benign reactive/reparative changes 12/04/2022 1233   DIAGPAP  07/25/2019 1637    - Negative for intraepithelial lesion or malignancy (NILM)   HPVHIGH Negative 12/04/2022 1233   HPVHIGH Negative 07/25/2019 1637   ADEQPAP  12/04/2022 1233    Satisfactory for evaluation; transformation zone component PRESENT.   ADEQPAP  07/25/2019 1637    Satisfactory for evaluation; transformation zone component PRESENT.   Gardasil: ***  Exercising: *** Smoker: ***  Flowsheet Row Office Visit from 02/17/2021 in [facility]  PHQ-2 Total Score 0       GYN HISTORY: ***  OB History  Gravida Para Term Preterm AB Living  1 1 1  0 0 1  SAB IAB Ectopic Multiple Live Births  0 0 0 0 1    # Outcome Date GA Lbr Len/2nd Weight Sex Type Anes PTL Lv  1 Term 05/09/19 [redacted]w[redacted]d 08:27 / 02:26 5 lb 14.7 oz (2.685 kg) F Vag-Spont EPI  LIV   No past medical history on file. No past surgical history on file. Current Outpatient Medications on File Prior to Visit  Medication Sig Dispense Refill   fluconazole  (DIFLUCAN ) 150 MG tablet Take 1 tablet (150 mg total) by mouth every 3 (three) days. 2 tablet 0   Multiple Vitamin (MULTIVITAMIN PO) Take by mouth.     nystatin -triamcinolone  ointment (MYCOLOG) Apply 1 Application topically 2 (two) times daily. 30 g 0   No current facility-administered medications on file prior to visit.   Social History   Socioeconomic History   Marital status: Married     Spouse name: Not on file   Number of children: Not on file   Years of education: Not on file   Highest education level: Not on file  Occupational History   Not on file  Tobacco Use   Smoking status: Never   Smokeless tobacco: Never  Vaping Use   Vaping status: Never Used  Substance and Sexual Activity   Alcohol use: Yes    Alcohol/week: 3.0 standard drinks of alcohol    Types: 3 Cans of beer per week   Drug use: Never   Sexual activity: Yes    Birth control/protection: I.U.D.    Comment: paragard  IUD 06/2019  Other Topics Concern   Not on file  Social History Narrative   Not on file   Social Drivers of Health   Financial Resource Strain: Not on file  Food Insecurity: Not on file  Transportation Needs: Not on file  Physical Activity: Not on file  Stress: Not on file  Social Connections: Not on file  Intimate Partner Violence: Not on file   Family History  Problem Relation Age of Onset   Thyroid  disease Mother    Hypertension Father    Breast cancer Paternal Grandmother        unsure age of onset   No Known Allergies  PE There were no vitals filed for this visit. There is no height or weight on  file to calculate BMI.  Physical Exam    Assessment and Plan:        There are no diagnoses linked to this encounter. Clotilda FORBES Pa, CMA

## 2024-01-04 DIAGNOSIS — O0281 Inappropriate change in quantitative human chorionic gonadotropin (hCG) in early pregnancy: Secondary | ICD-10-CM | POA: Diagnosis not present

## 2024-02-14 ENCOUNTER — Encounter (HOSPITAL_COMMUNITY): Payer: Self-pay

## 2024-02-14 ENCOUNTER — Encounter: Payer: Self-pay | Admitting: Obstetrics and Gynecology

## 2024-02-14 ENCOUNTER — Inpatient Hospital Stay (HOSPITAL_COMMUNITY)
Admission: AD | Admit: 2024-02-14 | Discharge: 2024-02-14 | Disposition: A | Discharge status: Home / Self Care | Attending: Family Medicine | Admitting: Family Medicine

## 2024-02-14 ENCOUNTER — Ambulatory Visit: Admitting: Obstetrics and Gynecology

## 2024-02-14 VITALS — BP 116/74 | HR 69

## 2024-02-14 DIAGNOSIS — Z789 Other specified health status: Secondary | ICD-10-CM

## 2024-02-14 DIAGNOSIS — Z3202 Encounter for pregnancy test, result negative: Secondary | ICD-10-CM | POA: Insufficient documentation

## 2024-02-14 DIAGNOSIS — N939 Abnormal uterine and vaginal bleeding, unspecified: Secondary | ICD-10-CM

## 2024-02-14 DIAGNOSIS — R1031 Right lower quadrant pain: Secondary | ICD-10-CM | POA: Diagnosis present

## 2024-02-14 LAB — POCT PREGNANCY, URINE: Preg Test, Ur: NEGATIVE

## 2024-02-14 NOTE — MAU Provider Note (Addendum)
 " History     CSN: [id number]  Arrival date and time: 02/14/24 9393   Event Date/Time   First Provider Initiated Contact with Patient 02/14/24 [phone]      Chief Complaint  Patient presents with   Abdominal Pain   Vaginal Bleeding   HPI Ms. Dawn Johnson is a 34 y.o. year old G5P1012 female at Unknown weeks gestation who presents to MAU reporting she had a SAB in October 2025. Her LMP in December was abnormal. She reports her period started 01/28/24 lasted 2-3 days, stopped for 2-3 days, then started back again for 2 days, then stopped and returned on 02/11/2024. She has not taken a HPT. She woke up this morning with a lower RT sided abdominal pain; rated 6/10. She has not taken any medications for the pain. She normally sees Dr. Bobie Cary for GYN care.   OB History     Gravida  2   Para  1   Term  1   Preterm  0   AB  1   Living  2      SAB  1   IAB  0   Ectopic  0   Multiple  0   Live Births  1           History reviewed. No pertinent past medical history.  Past Surgical History:  Procedure Laterality Date   NO PAST SURGERIES      Family History  Problem Relation Age of Onset   Thyroid  disease Mother    Hypertension Father    Breast cancer Paternal Grandmother        unsure age of onset    Social History[1]  Allergies: Allergies[2]  Medications Prior to Admission  Medication Sig Dispense Refill Last Dose/Taking   Prenatal Vit-Fe Fumarate-FA (MULTIVITAMIN-PRENATAL) 27-0.8 MG TABS tablet Take 1 tablet by mouth daily at 12 noon.   02/13/2024   fluconazole  (DIFLUCAN ) 150 MG tablet Take 1 tablet (150 mg total) by mouth every 3 (three) days. 2 tablet 0    Multiple Vitamin (MULTIVITAMIN PO) Take by mouth.      nystatin -triamcinolone  ointment (MYCOLOG) Apply 1 Application topically 2 (two) times daily. 30 g 0     Review of Systems  Constitutional: Negative.   HENT: Negative.    Eyes: Negative.   Respiratory: Negative.    Cardiovascular:  Negative.   Gastrointestinal: Negative.   Endocrine: Negative.   Genitourinary:  Positive for vaginal bleeding.  Musculoskeletal: Negative.   Skin: Negative.   Allergic/Immunologic: Negative.   Neurological: Negative.   Hematological: Negative.   Psychiatric/Behavioral: Negative.     Physical Exam   Blood pressure 111/68, pulse 83, temperature 97.9 F (36.6 C), resp. rate 16, height 5' 4.17 (1.63 m), weight 58.2 kg, last menstrual period 08/17/2023, SpO2 100%.  Physical Exam Vitals and nursing note reviewed.  Constitutional:      Appearance: Normal appearance. She is normal weight.  Pulmonary:     Effort: Pulmonary effort is normal.  Musculoskeletal:        General: Normal range of motion.  Skin:    General: Skin is warm and dry.  Neurological:     Mental Status: She is alert and oriented to person, place, and time.  Psychiatric:        Mood and Affect: Mood normal.        Behavior: Behavior normal.        Thought Content: Thought content normal.        Judgment:  Judgment normal.    MAU Course  Procedures  MDM UPT  Results for orders placed or performed during the hospital encounter of 02/14/24 (from the past 24 hours)  Pregnancy, urine POC     Status: None   Collection Time: 02/14/24  7:01 AM  Result Value Ref Range   Preg Test, Ur NEGATIVE NEGATIVE    Assessment and Plan  1. Not currently pregnant (Primary) - Notified of Negative UPT here - Patient relieved --- was worried about her pain being from an ectopic pregnancy  2. Abnormal uterine bleeding (AUB) - Advised to make an appt with Dr. Nikki to get a work-up and more testing if AUB continues   - Discharge home - Scheduled an appt with Dr. Nikki prn AUB work-up - Patient verbalized an understanding of the plan of care and agrees.   Ala Cart, CNM 02/14/2024, 7:18 AM      [1]  Social History Tobacco Use   Smoking status: Never   Smokeless tobacco: Never  Vaping Use   Vaping status: Never  Used  Substance Use Topics   Alcohol use: Not Currently   Drug use: Never  [2] No Known Allergies  "

## 2024-02-14 NOTE — Progress Notes (Signed)
 "  GYNECOLOGY  VISIT   HPI: 34 y.o.   Married  [place]  female   [phone] with Patient's last menstrual period was 01/28/2024 (exact date).   here for: Abnormal periods with prolonged bleeding. ER visit 02/14/23, lower abdominal pain on R ovaries. Miscarriage October 2025, bleeding has been abnormal since then. Bleeding off and on since 01/28/24. UPT at ER was negative this morning. Some right lower quadrant pain.  No medication for pain.     Paragard  IUD removal 10/28/23.  Normal regular menses prior to this.     Miscarriage at about [redacted] weeks gestation in October, 2025.  Bled for 4 weeks.  Had blood work following her hCG levels, which ultimately returned to a negative value.  Normal pelvic US  in November at [facility].  Bleeding off and on since 01/28/24.     Would like to achieve pregnancy.   Unprotected intercourse 02/13/23.   No partner change.    Applying for permanent visa for [place] .  GYNECOLOGIC HISTORY: Patient's last menstrual period was 01/28/2024 (exact date). Contraception:  None  Menopausal hormone therapy:  n/a Last 2 paps:  12/04/22 neg HR HPV neg, 07/25/19 neg, HR HPV neg  History of abnormal Pap or positive HPV:  no Mammogram:  04/18/20 Breast Density Cat D, BIRADS Cat 1 neg         OB History     Gravida  2   Para  1   Term  1   Preterm  0   AB  1   Living  2      SAB  1   IAB  0   Ectopic  0   Multiple  0   Live Births  1              Patient Active Problem List   Diagnosis Date Noted   Well woman exam with routine gynecological exam 12/04/2022   IUD contraception 12/04/2022   SVD (spontaneous vaginal delivery) 05/09/2019   Postpartum care following vaginal delivery 3/30 05/09/2019   Obstetrical laceration - R ML episiotomy 05/09/2019   Encounter for planned induction of labor 05/08/2019   Left breast mass 05/16/2018    History reviewed. No pertinent past medical history.  Past Surgical History:  Procedure Laterality Date    NO PAST SURGERIES      Current Outpatient Medications  Medication Sig Dispense Refill   Prenatal Vit-Fe Fumarate-FA (MULTIVITAMIN-PRENATAL) 27-0.8 MG TABS tablet Take 1 tablet by mouth daily at 12 noon.     No current facility-administered medications for this visit.     ALLERGIES: Patient has no known allergies.  Family History  Problem Relation Age of Onset   Thyroid  disease Mother    Hypertension Father    Breast cancer Paternal Grandmother        unsure age of onset    Social History   Socioeconomic History   Marital status: Married    Spouse name: Not on file   Number of children: Not on file   Years of education: Not on file   Highest education level: Not on file  Occupational History   Not on file  Tobacco Use   Smoking status: Never   Smokeless tobacco: Never  Vaping Use   Vaping status: Never Used  Substance and Sexual Activity   Alcohol use: Not Currently   Drug use: Never   Sexual activity: Yes    Partners: Male    Birth control/protection: None  Other Topics Concern  Not on file  Social History Narrative   Not on file   Social Drivers of Health   Tobacco Use: Low Risk (02/14/2024)   Patient History    Smoking Tobacco Use: Never    Smokeless Tobacco Use: Never    Passive Exposure: Not on file  Financial Resource Strain: Not on file  Food Insecurity: Not on file  Transportation Needs: Not on file  Physical Activity: Not on file  Stress: Not on file  Social Connections: Not on file  Intimate Partner Violence: Not on file  Depression (PHQ2-9): Low Risk (02/17/2021)   Depression (PHQ2-9)    PHQ-2 Score: 0  Alcohol Screen: Not on file  Housing: Not on file  Utilities: Not on file  Health Literacy: Not on file    Review of Systems  All other systems reviewed and are negative.   PHYSICAL EXAMINATION:   BP 116/74 (BP Location: Left Arm, Patient Position: Sitting)   Pulse 69   LMP 01/28/2024 (Exact Date)   SpO2 100%     General  appearance: alert, cooperative and appears stated age  Pelvic: External genitalia:  no lesions              Urethra:  normal appearing urethra with no masses, tenderness or lesions              Bartholins and Skenes: normal                 Vagina: normal appearing vagina with normal color and discharge, no lesions              Cervix: no lesions.  Small amount of bleeding.                 Bimanual Exam:  Uterus:  normal size, contour, position, consistency, mobility, non-tender              Adnexa: no mass, fullness, tenderness              Chaperone was present for exam:  Kari HERO, CMA  ASSESSMENT:  Abnormal uterine bleeding.   Status post miscarriage.   RLQ pain.  Possible ovulation pain.   Desire for pregnancy.   PLAN:  Will check serum hCG now.  If bleeding persists, Provera 10 mg x 10 days.  Would need to do UPT first.   If pain persists, pelvic ultrasound.  If develops nausea and vomiting in addition to pain, consider CT scan to check appendix. Continue PNV.   25 min  total time was spent for this patient encounter, including preparation, face-to-face counseling with the patient, coordination of care, and documentation of the encounter.    "

## 2024-02-14 NOTE — MAU Note (Signed)
..  Dawn Johnson is a 34 y.o. at Unknown here in MAU reporting:  Had a miscarriage in October. Period that began on 12/19 and has had intermittent light to moderate bleeding since then. This morning woke up with right sided lower abdominal cramping.  Has not taken an at home pregnancy test.  Has not had anything for pain.  LMP:  Pain score: 6/10 Vitals:   02/14/24 0636  BP: 111/68  Pulse: 83  Resp: 16  Temp: 97.9 F (36.6 C)  SpO2: 100%      Lab orders placed from triage:  POCT pregnancy

## 2024-02-15 ENCOUNTER — Ambulatory Visit: Payer: Self-pay | Admitting: Obstetrics and Gynecology

## 2024-02-15 LAB — HCG, QUANTITATIVE, PREGNANCY: HCG, Total, QN: <5 m[IU]/mL

## 2024-07-18 ENCOUNTER — Ambulatory Visit: Admitting: Obstetrics and Gynecology
